# Patient Record
Sex: Female | Born: 1955 | Race: White | Hispanic: No | Smoking: Never smoker
Health system: Southern US, Community
[De-identification: ages and names within clinical notes are randomized; demographics above are authoritative.]

## PROBLEM LIST (undated history)

## (undated) DIAGNOSIS — A6920 Lyme disease, unspecified: Secondary | ICD-10-CM

## (undated) DIAGNOSIS — I493 Ventricular premature depolarization: Secondary | ICD-10-CM

## (undated) HISTORY — PX: HEMORRHOID SURGERY: SHX153

## (undated) HISTORY — PX: TONSILLECTOMY: SUR1361

## (undated) HISTORY — PX: ABDOMINAL HYSTERECTOMY: SHX81

## (undated) HISTORY — DX: Ventricular premature depolarization: I49.3

## (undated) HISTORY — PX: CHOLECYSTECTOMY: SHX55

---

## 2013-11-26 ENCOUNTER — Ambulatory Visit: Payer: Self-pay | Admitting: Unknown Physician Specialty

## 2014-07-08 ENCOUNTER — Other Ambulatory Visit: Payer: Self-pay | Admitting: Internal Medicine

## 2014-07-08 DIAGNOSIS — Z1231 Encounter for screening mammogram for malignant neoplasm of breast: Secondary | ICD-10-CM

## 2014-08-10 ENCOUNTER — Ambulatory Visit
Admission: RE | Admit: 2014-08-10 | Discharge: 2014-08-10 | Disposition: A | Discharge status: Home / Self Care | Payer: BC Managed Care – PPO | Source: Ambulatory Visit | Attending: Internal Medicine | Admitting: Internal Medicine

## 2014-08-10 ENCOUNTER — Other Ambulatory Visit: Payer: Self-pay | Admitting: Internal Medicine

## 2014-08-10 DIAGNOSIS — Z1231 Encounter for screening mammogram for malignant neoplasm of breast: Secondary | ICD-10-CM

## 2020-10-02 ENCOUNTER — Emergency Department (HOSPITAL_COMMUNITY)
Admission: EM | Admit: 2020-10-02 | Discharge: 2020-10-02 | Disposition: A | Discharge status: Home / Self Care | Payer: Medicare Other | Attending: Emergency Medicine | Admitting: Emergency Medicine

## 2020-10-02 ENCOUNTER — Encounter (HOSPITAL_COMMUNITY): Payer: Self-pay | Admitting: Emergency Medicine

## 2020-10-02 ENCOUNTER — Emergency Department (HOSPITAL_COMMUNITY): Payer: Medicare Other

## 2020-10-02 ENCOUNTER — Other Ambulatory Visit: Payer: Self-pay

## 2020-10-02 DIAGNOSIS — R079 Chest pain, unspecified: Secondary | ICD-10-CM

## 2020-10-02 DIAGNOSIS — R42 Dizziness and giddiness: Secondary | ICD-10-CM | POA: Diagnosis not present

## 2020-10-02 DIAGNOSIS — R0602 Shortness of breath: Secondary | ICD-10-CM | POA: Insufficient documentation

## 2020-10-02 DIAGNOSIS — R0789 Other chest pain: Secondary | ICD-10-CM | POA: Diagnosis present

## 2020-10-02 LAB — CBC
HCT: 43.6 % (ref 36.0–46.0)
Hemoglobin: 15 g/dL (ref 12.0–15.0)
MCH: 32.3 pg (ref 26.0–34.0)
MCHC: 34.4 g/dL (ref 30.0–36.0)
MCV: 94 fL (ref 80.0–100.0)
Platelets: 291 10*3/uL (ref 150–400)
RBC: 4.64 MIL/uL (ref 3.87–5.11)
RDW: 13 % (ref 11.5–15.5)
WBC: 16.2 10*3/uL — ABNORMAL HIGH (ref 4.0–10.5)
nRBC: 0 % (ref 0.0–0.2)

## 2020-10-02 LAB — BASIC METABOLIC PANEL
Anion gap: 13 (ref 5–15)
BUN: 12 mg/dL (ref 8–23)
CO2: 22 mmol/L (ref 22–32)
Calcium: 9.4 mg/dL (ref 8.9–10.3)
Chloride: 105 mmol/L (ref 98–111)
Creatinine, Ser: 0.8 mg/dL (ref 0.44–1.00)
GFR, Estimated: >60 mL/min (ref >60)
Glucose, Bld: 104 mg/dL — ABNORMAL HIGH (ref 70–99)
Potassium: 3.5 mmol/L (ref 3.5–5.1)
Sodium: 140 mmol/L (ref 135–145)

## 2020-10-02 LAB — TROPONIN I (HIGH SENSITIVITY)
Troponin I (High Sensitivity): <2 ng/L (ref <18)
Troponin I (High Sensitivity): 3 ng/L (ref <18)

## 2020-10-02 LAB — PROTIME-INR
INR: 1.1 (ref 0.8–1.2)
Prothrombin Time: 13.3 seconds (ref 11.4–15.2)

## 2020-10-02 NOTE — ED Triage Notes (Signed)
Patient reports central chest pain " pressure" radiating to upper back with mild SOB , no emesis or diaphoresis , no cough or fever .

## 2020-10-02 NOTE — ED Provider Notes (Incomplete)
[facility] DEPART[facility] Provider Note   CSN: [id number] Arrival date & time: 10/02/20  1949     History Chief Complaint  Patient presents with  . Chest Pain    Natalie Wyatt is a 65 y.o. female.  65 year old female presents to the emergency department for evaluation of chest pain.  She reports a central chest pain that she describes as a pressure sensation.  It began as she was sitting down to eat dinner around 1800.  It has remained constant, but has been spontaneously improving.  Reports radiation of the pain to her upper back.  Has had mild shortness of breath as well as lightheadedness.  Had moments of feeling hot and cold, but was not diaphoretic and has not experienced any nausea, vomiting, cough, fever, hemoptysis, leg swelling, syncope, or recent illness.  Denies any exertional component to her chest pain.  Took a Flexeril and an expired Xanax for her pain PTA without any notable change.  Did have a similar episode of pain in October, but never sought evaluation.  No PMH of HTN, HLD, CAD, tobacco use. Reports FHx of ACS in father who passed in his 64's from an MI. Had a normal TTE in 2020.  The history is provided by the patient. No language interpreter was used.       History reviewed. No pertinent past medical history.  There are no problems to display for this patient.   History reviewed. No pertinent surgical history.   OB History   No obstetric history on file.     No family history on file.  Social History   Tobacco Use  . Smoking status: Never Smoker  . Smokeless tobacco: Never Used  Substance Use Topics  . Alcohol use: Never  . Drug use: Never    Home Medications Prior to Admission medications   Not on File    Allergies    Patient has no known allergies.  Review of Systems   Review of Systems  Ten systems reviewed and are negative for acute change, except as noted in the HPI.    Physical Exam Updated Vital Signs BP  137/79 (BP Location: Right Arm)   Pulse 84   Temp 98.6 F (37 C) (Oral)   Resp 15   Ht 5\' 2"  (1.575 m)   Wt 72 kg   SpO2 100%   BMI 29.03 kg/m   Physical Exam Vitals and nursing note reviewed.  Constitutional:      General: She is not in acute distress.    Appearance: She is well-developed and well-nourished. She is not diaphoretic.     Comments: Nontoxic appearing and in NAD  HENT:     Head: Normocephalic and atraumatic.  Eyes:     General: No scleral icterus.    Extraocular Movements: EOM normal.     Conjunctiva/sclera: Conjunctivae normal.  Cardiovascular:     Rate and Rhythm: Normal rate and regular rhythm.     Pulses: Normal pulses.  Pulmonary:     Effort: Pulmonary effort is normal. No respiratory distress.     Breath sounds: No stridor. No wheezing, rhonchi or rales.     Comments: Lungs CTAB. Respirations even and unlabored. Musculoskeletal:        General: Normal range of motion.     Cervical back: Normal range of motion.  Skin:    General: Skin is warm and dry.     Coloration: Skin is not pale.     Findings: No erythema  or rash.  Neurological:     Mental Status: She is alert and oriented to person, place, and time.     Coordination: Coordination normal.  Psychiatric:        Mood and Affect: Mood and affect normal.        Behavior: Behavior normal.     ED Results / Procedures / Treatments   Labs (all labs ordered are listed, but only abnormal results are displayed) Labs Reviewed  BASIC METABOLIC PANEL - Abnormal; Notable for the following components:      Result Value   Glucose, Bld 104 (*)    All other components within normal limits  CBC - Abnormal; Notable for the following components:   WBC 16.2 (*)    All other components within normal limits  PROTIME-INR  TROPONIN I (HIGH SENSITIVITY)  TROPONIN I (HIGH SENSITIVITY)    EKG EKG Interpretation  Date/Time:  Sunday October 02 2020 19:55:17 EST Ventricular Rate:  79 PR Interval:  178 QRS  Duration: 80 QT Interval:  380 QTC Calculation: 435 R Axis:   57 Text Interpretation: Sinus rhythm with frequent Premature ventricular complexes Nonspecific T wave abnormality Abnormal ECG Baseline wander No old tracing to compare Confirmed by Deno Etienne [phone]) on 10/02/2020 11:01:46 PM   Radiology DG Chest 2 View  Result Date: 10/02/2020 CLINICAL DATA:  Chest pressure with shortness of breath. EXAM: CHEST - 2 VIEW COMPARISON:  None. FINDINGS: There is no evidence of acute infiltrate, pleural effusion or pneumothorax. The heart size and mediastinal contours are within normal limits. Mild calcification of the aortic arch is seen. The visualized skeletal structures are unremarkable. IMPRESSION: No active cardiopulmonary disease. Electronically Signed   By: Virgina Norfolk M.D.   On: 10/02/2020 20:33    Procedures Procedures {Remember to document critical care time when appropriate:1}  Medications Ordered in ED Medications - No data to display  ED Course  I have reviewed the triage vital signs and the nursing notes.  Pertinent labs & imaging results that were available during my care of the patient were reviewed by me and considered in my medical decision making (see chart for details).    MDM Rules/Calculators/A&P                          ***  Vitals:   10/02/20 1957 10/02/20 2001 10/02/20 2117 10/02/20 2321  BP: (!) 162/85  (!) 167/89 137/79  Pulse: 76  89 84  Resp: 17  17 15   Temp: 98.2 F (36.8 C)   98.6 F (37 C)  TempSrc:    Oral  SpO2: 98%  98% 100%  Weight:  72 kg    Height:  5\' 2"  (1.575 m)      Final Clinical Impression(s) / ED Diagnoses Final diagnoses:  Nonspecific chest pain    Rx / DC Orders ED Discharge Orders    None

## 2020-10-02 NOTE — ED Provider Notes (Signed)
[name]   CSN: [id number] Arrival date & time: 10/02/20  1949     History Chief Complaint  Natalie Wyatt presents with  . Chest Pain    Natalie Wyatt is a 65 y.o. female.  65 year old female presents to the emergency department for evaluation of chest pain.  She reports a central chest pain that she describes as a pressure sensation.  It began as she was sitting down to eat dinner around 1800.  It has remained constant, but has been spontaneously improving.  Reports radiation of the pain to her upper back.  Has had mild shortness of breath as well as lightheadedness.  Had moments of feeling hot and cold, but was not diaphoretic and has not experienced any nausea, vomiting, cough, fever, hemoptysis, leg swelling, syncope, or recent illness.  Denies any exertional component to her chest pain.  Took a Flexeril and an expired Xanax for her pain PTA without any notable change.  Did have a similar episode of pain in October, but never sought evaluation.  No PMH of HTN, HLD, CAD, tobacco use. Reports FHx of ACS in father who passed in his 64's from an MI. Had a normal TTE in 2020.  The history is provided by the Natalie Wyatt. No language interpreter was used.       History reviewed. No pertinent past medical history.  There are no problems to display for this Natalie Wyatt.   History reviewed. No pertinent surgical history.   OB History   No obstetric history on file.     No family history on file.  Social History   Tobacco Use  . Smoking status: Never Smoker  . Smokeless tobacco: Never Used  Substance Use Topics  . Alcohol use: Never  . Drug use: Never    Home Medications Prior to Admission medications   Not on File    Allergies    Natalie Wyatt has no known allergies.  Review of Systems   Review of Systems  Ten systems reviewed and are negative for acute change, except as noted in the HPI.    Physical Exam Updated Vital Signs BP  137/79 (BP Location: Right Arm)   Pulse 84   Temp 98.6 F (37 C) (Oral)   Resp 15   Ht 5\' 2"  (1.575 m)   Wt 72 kg   SpO2 100%   BMI 29.03 kg/m   Physical Exam Vitals and nursing note reviewed.  Constitutional:      General: She is not in acute distress.    Appearance: She is well-developed and well-nourished. She is not diaphoretic.     Comments: Nontoxic appearing and in NAD  HENT:     Head: Normocephalic and atraumatic.  Eyes:     General: No scleral icterus.    Extraocular Movements: EOM normal.     Conjunctiva/sclera: Conjunctivae normal.  Cardiovascular:     Rate and Rhythm: Normal rate and regular rhythm.     Pulses: Normal pulses.  Pulmonary:     Effort: Pulmonary effort is normal. No respiratory distress.     Breath sounds: No stridor. No wheezing, rhonchi or rales.     Comments: Lungs CTAB. Respirations even and unlabored. Musculoskeletal:        General: Normal range of motion.     Cervical back: Normal range of motion.  Skin:    General: Skin is warm and dry.     Coloration: Skin is not pale.     Findings: No erythema  or rash.  Neurological:     Mental Status: She is alert and oriented to person, place, and time.     Coordination: Coordination normal.  Psychiatric:        Mood and Affect: Mood and affect normal.        Behavior: Behavior normal.     ED Results / Procedures / Treatments   Labs (all labs ordered are listed, but only abnormal results are displayed) Labs Reviewed  BASIC METABOLIC PANEL - Abnormal; Notable for the following components:      Result Value   Glucose, Bld 104 (*)    All other components within normal limits  CBC - Abnormal; Notable for the following components:   WBC 16.2 (*)    All other components within normal limits  PROTIME-INR  TROPONIN I (HIGH SENSITIVITY)  TROPONIN I (HIGH SENSITIVITY)    EKG EKG Interpretation  Date/Time:  Sunday October 02 2020 19:55:17 EST Ventricular Rate:  79 PR Interval:  178 QRS  Duration: 80 QT Interval:  380 QTC Calculation: 435 R Axis:   57 Text Interpretation: Sinus rhythm with frequent Premature ventricular complexes Nonspecific T wave abnormality Abnormal ECG Baseline wander No old tracing to compare Confirmed by Deno Etienne [phone]) on 10/02/2020 11:01:46 PM   Radiology DG Chest 2 View  Result Date: 10/02/2020 CLINICAL DATA:  Chest pressure with shortness of breath. EXAM: CHEST - 2 VIEW COMPARISON:  None. FINDINGS: There is no evidence of acute infiltrate, pleural effusion or pneumothorax. The heart size and mediastinal contours are within normal limits. Mild calcification of the aortic arch is seen. The visualized skeletal structures are unremarkable. IMPRESSION: No active cardiopulmonary disease. Electronically Signed   By: Virgina Norfolk M.D.   On: 10/02/2020 20:33    Procedures Procedures   Medications Ordered in ED Medications - No data to display  ED Course  I have reviewed the triage vital signs and the nursing notes.  Pertinent labs & imaging results that were available during my care of the Natalie Wyatt were reviewed by me and considered in my medical decision making (see chart for details).    MDM Rules/Calculators/A&P                          Natalie Wyatt presents to the emergency department for evaluation of chest pain.  Symptom onset around 1800, now spontaneously improving.  Symptoms a bit atypical for ACS.  No associated nausea, diaphoresis; symptoms not aggravated by exertion.  Work up today is reassuring.  EKG is nonischemic and troponin negative.  Chest x-ray without evidence of mediastinal widening to suggest dissection.  No pneumothorax, pneumonia, pleural effusion.  Pulmonary embolus further considered; however, Natalie Wyatt without tachycardia, tachypnea, dyspnea, hypoxia.  Well's negative.  Have advised close follow-up with the Natalie Wyatt's primary care doctor.  We will also refer to cardiology.  She has been advised to have an outpatient stress test  completed.  She is reliable for follow-up and verbalizes understanding of need to return for worsening symptoms.  Discharged in stable condition with no unaddressed concerns.  Vitals:   10/02/20 1957 10/02/20 2001 10/02/20 2117 10/02/20 2321  BP: (!) 162/85  (!) 167/89 137/79  Pulse: 76  89 84  Resp: 17  17 15   Temp: 98.2 F (36.8 C)   98.6 F (37 C)  TempSrc:    Oral  SpO2: 98%  98% 100%  Weight:  72 kg    Height:  5\' 2"  (1.575 m)  Final Clinical Impression(s) / ED Diagnoses Final diagnoses:  Nonspecific chest pain    Rx / DC Orders ED Discharge Orders    None       Antonietta Breach, PA-C 10/03/20 [place], [place], DO 10/04/20 1449

## 2020-10-02 NOTE — Discharge Instructions (Signed)
Your work-up in the emergency department today was reassuring, though we recommend follow-up with your primary care doctor as well as cardiologist.  You would benefit from having a stress test completed within the next 30 days.  Return to the emergency department for any new or concerning symptoms.

## 2020-10-02 NOTE — ED Provider Notes (Signed)
[facility] EMERGENCY DEPARTMENT Provider Note   CSN: [id number] Arrival date & time: 10/02/20  1949     History Chief Complaint  Patient presents with  . Chest Pain    Natalie Wyatt is a 65 y.o. female.  65 year old female presents to the emergency department for evaluation of chest pain.  She describes her chest pain as a pressure.  It is located in her central chest.  Has had some radiation of the pain to her back with mild shortness of breath.  Denies any worsening pain with exertion or change in position.  It has been spontaneously improving.  No associated fevers, nausea, vomiting, diaphoresis, cough, leg swelling, extremity numbness or paresthesias.  She tried taking a Flexeril for symptoms without relief.  Denies personal history of ACS, CAD.  Patient compliant with her daily medications.    She has a history of similar pain a few years ago.  Was evaluated by cardiology and had reassuring TTE in 2020.  EF 55 to 60%.   The history is provided by the patient. No language interpreter was used.       History reviewed. No pertinent past medical history.  There are no problems to display for this patient.   Past Surgical History:  Procedure Laterality Date  . ABDOMINAL HYSTERECTOMY    . CESAREAN SECTION    . HEMORRHOID SURGERY    . TONSILLECTOMY       OB History   No obstetric history on file.     Family History  Problem Relation Age of Onset  . Heart attack Father     Social History   Tobacco Use  . Smoking status: Never Smoker  . Smokeless tobacco: Never Used  Substance Use Topics  . Alcohol use: Never  . Drug use: Never    Home Medications Prior to Admission medications   Medication Sig Start Date End Date Taking? Authorizing Provider  albuterol (VENTOLIN HFA) 108 (90 Base) MCG/ACT inhaler Inhale into the lungs every 6 (six) hours as needed for wheezing or shortness of breath.    [provider]  buPROPion (WELLBUTRIN XL) 300  MG 24 hr tablet Take 300 mg by mouth daily.    [provider]  butalbital-aspirin-caffeine Acquanetta Chain) 50-325-40 MG capsule Take 1 capsule by mouth 2 (two) times daily as needed for headache.    [provider]  cyclobenzaprine (FLEXERIL) 5 MG tablet Take 5 mg by mouth 3 (three) times daily as needed for muscle spasms.    [provider]  EPINEPHrine 0.3 mg/0.3 mL IJ SOAJ injection Inject 0.3 mg into the muscle as needed for anaphylaxis.    [provider]  estradiol (ESTRACE) 2 MG tablet Take 2 mg by mouth daily.    [provider]  hydrochlorothiazide (HYDRODIURIL) 25 MG tablet Take 12.5 mg by mouth daily.    [provider]  metoprolol tartrate (LOPRESSOR) 100 MG tablet Take 1 tablet by mouth once for procedure. 10/17/20   O'NealCassie Freer, MD  naproxen (NAPROSYN) 500 MG tablet Take 1,000 mg by mouth 2 (two) times daily with a meal.    [provider]  pantoprazole (PROTONIX) 40 MG tablet Take 40 mg by mouth daily.    [provider]  zolpidem (AMBIEN) 5 MG tablet Take 5 mg by mouth once.    [provider]    Allergies    Patient has no known allergies.  Review of Systems   Review of Systems  Ten systems  reviewed and are negative for acute change, except as noted in the HPI.    Physical Exam Updated Vital Signs BP 137/79 (BP Location: Right Arm)   Pulse 84   Temp 98.6 F (37 C) (Oral)   Resp 15   Ht 5\' 2"  (1.575 m)   Wt 72 kg   SpO2 100%   BMI 29.03 kg/m   Physical Exam Vitals and nursing note reviewed.  Constitutional:      General: She is not in acute distress.    Appearance: She is well-developed. She is not diaphoretic.     Comments: Nontoxic appearing and in NAD  HENT:     Head: Normocephalic and atraumatic.  Eyes:     General: No scleral icterus.    Conjunctiva/sclera: Conjunctivae normal.  Cardiovascular:     Rate and Rhythm: Normal rate and regular rhythm.     Pulses: Normal  pulses.  Pulmonary:     Effort: Pulmonary effort is normal. No respiratory distress.     Breath sounds: No stridor. No wheezing or rales.     Comments: Lungs CTAB. Respirations even and unlabored. Musculoskeletal:        General: Normal range of motion.     Cervical back: Normal range of motion.     Comments: No BLE edema.  Skin:    General: Skin is warm and dry.     Coloration: Skin is not pale.     Findings: No erythema or rash.  Neurological:     Mental Status: She is alert and oriented to person, place, and time.     Coordination: Coordination normal.  Psychiatric:        Behavior: Behavior normal.     ED Results / Procedures / Treatments   Labs (all labs ordered are listed, but only abnormal results are displayed) Labs Reviewed  BASIC METABOLIC PANEL - Abnormal; Notable for the following components:      Result Value   Glucose, Bld 104 (*)    All other components within normal limits  CBC - Abnormal; Notable for the following components:   WBC 16.2 (*)    All other components within normal limits  PROTIME-INR  TROPONIN I (HIGH SENSITIVITY)  TROPONIN I (HIGH SENSITIVITY)    EKG EKG Interpretation  Date/Time:  Sunday October 02 2020 19:55:17 EST Ventricular Rate:  79 PR Interval:  178 QRS Duration: 80 QT Interval:  380 QTC Calculation: 435 R Axis:   57 Text Interpretation: Sinus rhythm with frequent Premature ventricular complexes Nonspecific T wave abnormality Abnormal ECG Baseline wander No old tracing to compare Confirmed by Deno Etienne [phone]) on 10/02/2020 11:01:46 PM   Radiology DG Chest 2 View  Result Date: 10/02/2020 CLINICAL DATA:  Chest pressure with shortness of breath. EXAM: CHEST - 2 VIEW COMPARISON:  None. FINDINGS: There is no evidence of acute infiltrate, pleural effusion or pneumothorax. The heart size and mediastinal contours are within normal limits. Mild calcification of the aortic arch is seen. The visualized skeletal structures are unremarkable.  IMPRESSION: No active cardiopulmonary disease. Electronically Signed   By: Virgina Norfolk M.D.   On: 10/02/2020 20:33    Procedures Procedures   Medications Ordered in ED Medications - No data to display  ED Course  I have reviewed the triage vital signs and the nursing notes.  Pertinent labs & imaging results that were available during my care of the patient were reviewed by me and considered in my medical decision making (see chart for details).  MDM Rules/Calculators/A&P                          Patient presents to the emergency department for evaluation of chest pain, now spontaneously improved.  Low suspicion for emergent cardiac etiology given reassuring workup today.  EKG not concerning for acute ischemia and troponin negative x 2.  Chest x-ray without evidence of mediastinal widening to suggest dissection.  No pneumothorax, pneumonia, pleural effusion.  Pulmonary embolus further considered; however, patient without tachycardia, tachypnea, hypoxia.  Symptoms atypical for PE.  Has a history of similar chest pain in 2020.  Underwent echocardiogram at this time which was reassuring.  Have recommended close outpatient follow-up with cardiology as patient would benefit from repeat echo, stress test.  Referral provided to [facility] cardiology.  She has been advised to return to the ED if symptoms persist or worsen.  Discharged in stable condition with no unaddressed concerns.   Final Clinical Impression(s) / ED Diagnoses Final diagnoses:  Nonspecific chest pain    Rx / DC Orders ED Discharge Orders    None       Antonietta Breach, PA-C 10/19/20 [place], [place], DO 10/19/20 [phone]

## 2020-10-10 ENCOUNTER — Telehealth: Payer: Self-pay

## 2020-10-10 NOTE — Telephone Encounter (Signed)
[facility] [phone], SENT REFERRAL TO [facility]

## 2020-10-14 ENCOUNTER — Telehealth: Payer: Self-pay

## 2020-10-14 NOTE — Telephone Encounter (Signed)
Faxed notes to [facility] for appointment with Dr. Audie Box on 10-17-20 at 11:20 a.

## 2020-10-16 NOTE — Progress Notes (Signed)
Cardiology Office Note:   Date:  10/17/2020  NAME:  Natalie Wyatt    MRN: [id number] DOB:  12-03-1955   PCP:  Delano Metz, MD  Cardiologist:  No primary care provider on file.  Electrophysiologist:  None   Referring MD: Gae Gallop, PA-C   Chief Complaint  Patient presents with  . Chest Pain    History of Present Illness:   Natalie Wyatt is a 65 y.o. female without medical history who is being seen today for the evaluation of chest pain at the request of Delano Metz, MD. Seen in ER 10/02/2020 for CP. Negative ACS work up. Follow-up today.  She reports that she went to the emergency room with intense chest pressure that went into her upper back.  She reports the pain started as cramping in her abdomen.  She reports symptoms started randomly.  She reports no identifiable triggers or alleviating factors.  Her symptoms resolved while waiting to be seen in the emergency room.  She was seen in the emergency room and work-up was negative for an acute coronary syndrome.  She does have PVCs on her EKG today.  Apparently she has had PVCs for quite some time.  She had an echocardiogram at [facility] on 04/22/2019 that showed normal LV function.  She had mild mitral regurgitation.  She is never had a stress test that I can tell.  She reports her symptoms were quite bothersome.  Her father had a history of heart disease.  I do not have her lipid profile but she reports no medical history.  She takes HCTZ for lower extremity edema.  She has never had a heart attack or a stroke.  She does take Wellbutrin.  She has had several surgeries but no heart procedures.  She is a never smoker.  She does not drink alcohol or use drugs.  She is a retired Radio producer.  She now works part-time with her husband who is a Engineer, maintenance (IT).  They currently live close to [place].  She reports is not stressed.  Her symptoms have resolved.  She was noted to have an elevated white blood cell count in the emergency room.  She has no infectious  symptoms.  She denies any cough, fevers, chills, shortness of breath today in office.  She reports her chest pain symptoms have not recurred.  Past Medical History: History reviewed. No pertinent past medical history.  Past Surgical History: Past Surgical History:  Procedure Laterality Date  . ABDOMINAL HYSTERECTOMY    . CESAREAN SECTION    . HEMORRHOID SURGERY    . TONSILLECTOMY      Current Medications: Current Meds  Medication Sig  . albuterol (VENTOLIN HFA) 108 (90 Base) MCG/ACT inhaler Inhale into the lungs every 6 (six) hours as needed for wheezing or shortness of breath.  Marland Kitchen buPROPion (WELLBUTRIN XL) 300 MG 24 hr tablet Take 300 mg by mouth daily.  . butalbital-aspirin-caffeine (FIORINAL) 50-325-40 MG capsule Take 1 capsule by mouth 2 (two) times daily as needed for headache.  . cyclobenzaprine (FLEXERIL) 5 MG tablet Take 5 mg by mouth 3 (three) times daily as needed for muscle spasms.  Marland Kitchen EPINEPHrine 0.3 mg/0.3 mL IJ SOAJ injection Inject 0.3 mg into the muscle as needed for anaphylaxis.  Marland Kitchen estradiol (ESTRACE) 2 MG tablet Take 2 mg by mouth daily.  . hydrochlorothiazide (HYDRODIURIL) 25 MG tablet Take 12.5 mg by mouth daily.  . metoprolol tartrate (LOPRESSOR) 100 MG tablet Take 1 tablet by mouth once for procedure.  Marland Kitchen  naproxen (NAPROSYN) 500 MG tablet Take 1,000 mg by mouth 2 (two) times daily with a meal.  . pantoprazole (PROTONIX) 40 MG tablet Take 40 mg by mouth daily.  Marland Kitchen zolpidem (AMBIEN) 5 MG tablet Take 5 mg by mouth once.     Allergies:    Patient has no known allergies.   Social History: Social History   Socioeconomic History  . Marital status: Married    Spouse name: Not on file  . Number of children: 2  . Years of education: Not on file  . Highest education level: Not on file  Occupational History  . Occupation: retired Education officer, museum  Tobacco Use  . Smoking status: Never Smoker  . Smokeless tobacco: Never Used  Substance and Sexual Activity  . Alcohol  use: Never  . Drug use: Never  . Sexual activity: Not on file  Other Topics Concern  . Not on file  Social History Narrative  . Not on file   Social Determinants of Health   Financial Resource Strain: Not on file  Food Insecurity: Not on file  Transportation Needs: Not on file  Physical Activity: Not on file  Stress: Not on file  Social Connections: Not on file     Family History: The patient's family history includes Heart attack in her father.  ROS:   All other ROS reviewed and negative. Pertinent positives noted in the HPI.     EKGs/Labs/Other Studies Reviewed:   The following studies were personally reviewed by me today:  EKG:  EKG is ordered today.  The ekg ordered today demonstrates normal sinus rhythm heart rate 67, frequent PVCs noted, and was personally reviewed by me.   Recent Labs: 10/02/2020: BUN 12; Creatinine, Ser 0.80; Hemoglobin 15.0; Platelets 291; Potassium 3.5; Sodium 140   Recent Lipid Panel No results found for: CHOL, TRIG, HDL, CHOLHDL, VLDL, LDLCALC, LDLDIRECT  Physical Exam:   VS:  BP (!) 142/76 (BP Location: Left Arm, Patient Position: Sitting, Cuff Size: Normal)   Pulse 67   Ht 5\' 2"  (1.575 m)   Wt 158 lb (71.7 kg)   BMI 28.90 kg/m    Wt Readings from Last 3 Encounters:  10/17/20 158 lb (71.7 kg)  10/02/20 158 lb 11.7 oz (72 kg)    General: Well nourished, well developed, in no acute distress Head: Atraumatic, normal size  Eyes: PEERLA, EOMI  Neck: Supple, no JVD Endocrine: No thryomegaly Cardiac: Normal S1, S2; irregular rhythm, no murmurs rubs or gallops Lungs: Clear to auscultation bilaterally, no wheezing, rhonchi or rales  Abd: Soft, nontender, no hepatomegaly  Ext: No edema, pulses 2+ Musculoskeletal: No deformities, BUE and BLE strength normal and equal Skin: Warm and dry, no rashes   Neuro: Alert and oriented to person, place, time, and situation, CNII-XII grossly intact, no focal deficits  Psych: Normal mood and affect    ASSESSMENT:   Natalie Wyatt is a 65 y.o. female who presents for the following: 1. Precordial pain   2. PVC (premature ventricular contraction)   3. Irregular heart rate   4. Abnormal findings on diagnostic imaging of heart and coronary circulation      PLAN:   1. Chest pain, unspecified type -Atypical chest pain.  Described as pressure at rest.  Symptoms with away without intervention.  Seen in the emergency room and work-up was negative for an acute coronary syndrome.  She was then sent to follow-up with [place].  Her white blood cell count was elevated but she had no infectious  symptoms.  I would like to repeat her CBC.  Her EKG demonstrates sinus rhythm with PVCs.  She needs the PVCs evaluated see below.  Given her chest pain episodes of PVCs I think this is a good time to evaluate her coronaries with a coronary CTA.  We will recheck a BMP.  She will take 100 mg of metoprolol tartrate 2 hours before the scan.  She needs a repeat echo as well as Zio patch discussed below.  Overall I suspect this was gas or acid reflux related.  I do not think this is going to be related to obstructive CAD but will make sure.  2. PVC (premature ventricular contraction) -PVCs on her EKG today.  She has had these for years.  Echo in 2020 was normal.  I would like to repeat an echocardiogram here.  I would also like to quantify her PVC burden with a 3-day Zio patch.  We also will check a CBC, BMP and TSH.  We will make sure electrolytes are stable and her thyroid is normal.  She has no evidence of heart failure.  She has no symptoms from this.  This would only need to be treated if she has very burdensome level of PVCs or if she develops symptoms or has congestive heart failure which she does not currently on examination today.  Disposition: Return in about 3 months (around 01/17/2021).  Medication Adjustments/Labs and Tests Ordered: Current medicines are reviewed at length with the patient today.  Concerns regarding  medicines are outlined above.  Orders Placed This Encounter  Procedures  . CT CORONARY MORPH W/CTA COR W/SCORE W/CA W/CM &/OR WO/CM  . CT CORONARY FRACTIONAL FLOW RESERVE DATA PREP  . CT CORONARY FRACTIONAL FLOW RESERVE FLUID ANALYSIS  . CBC  . TSH  . Basic metabolic panel  . LONG TERM MONITOR (3-14 DAYS)  . EKG 12-Lead  . ECHOCARDIOGRAM COMPLETE   Meds ordered this encounter  Medications  . metoprolol tartrate (LOPRESSOR) 100 MG tablet    Sig: Take 1 tablet by mouth once for procedure.    Dispense:  1 tablet    Refill:  0    Patient Instructions  Medication Instructions:  Take Metoprolol 100 mg two hours before CT when scheduled.   *If you need a refill on your cardiac medications before your next appointment, please call your pharmacy*   Lab Work: CBC, BMET, TSH today  If you have labs (blood work) drawn today and your tests are completely normal, you will receive your results only by: Marland Kitchen MyChart Message (if you have MyChart) OR . A paper copy in the mail If you have any lab test that is abnormal or we need to change your treatment, we will call you to review the results.   Testing/Procedures:  Your physician has requested that you have cardiac CT. Cardiac computed tomography (CT) is a painless test that uses an x-ray machine to take clear, detailed pictures of your heart. For further information please visit [email or web address].tn. Please follow instruction sheet as given.  Echocardiogram - Your physician has requested that you have an echocardiogram. Echocardiography is a painless test that uses sound waves to create images of your heart. It provides your doctor with information about the size and shape of your heart and how well your heart's chambers and valves are working. This procedure takes approximately one hour. There are no restrictions for this procedure. This will be performed at our [facility] location - [address].  ZIO XT- Long Term Monitor  Instructions   Your physician has requested you wear your ZIO patch monitor____3___days.   This is a single patch monitor.  [employer] supplies one patch monitor per enrollment.  Additional stickers are not available.   Please do not apply patch if you will be having a Nuclear Stress Test, Echocardiogram, Cardiac CT, MRI, or Chest Xray during the time frame you would be wearing the monitor. The patch cannot be worn during these tests.  You cannot remove and re-apply the ZIO XT patch monitor.   Your ZIO patch monitor will be sent USPS Priority mail from [facility] directly to your home address. The monitor may also be mailed to a PO BOX if home delivery is not available.   It may take 3-5 days to receive your monitor after you have been enrolled.   Once you have received you monitor, please review enclosed instructions.  Your monitor has already been registered assigning a specific monitor serial # to you.   Applying the monitor   Shave hair from upper left chest.   Hold abrader disc by orange tab.  Rub abrader in 40 strokes over left upper chest as indicated in your monitor instructions.   Clean area with 4 enclosed alcohol pads .  Use all pads to assure are is cleaned thoroughly.  Let dry.   Apply patch as indicated in monitor instructions.  Patch will be place under collarbone on left side of chest with arrow pointing upward.   Rub patch adhesive wings for 2 minutes.Remove white label marked "1".  Remove white label marked "2".  Rub patch adhesive wings for 2 additional minutes.   While looking in a mirror, press and release button in center of patch.  A small green light will flash 3-4 times .  This will be your only indicator the monitor has been turned on.     Do not shower for the first 24 hours.  You may shower after the first 24 hours.   Press button if you feel a symptom. You will hear a small click.  Record Date, Time and Symptom in the Patient Log Book.   When you are  ready to remove patch, follow instructions on last 2 pages of Patient Log Book.  Stick patch monitor onto last page of Patient Log Book.   Place Patient Log Book in [place] box.  Use locking tab on box and tape box closed securely.  The [employer] has [employer] on it.  Please place in mailbox as soon as possible.  Your physician should have your test results approximately 7 days after the monitor has been mailed back to [facility].   Call [place] at [phone] if you have questions regarding your ZIO XT patch monitor.  Call them immediately if you see an orange light blinking on your monitor.   If your monitor falls off in less than 4 days contact our Monitor department at [phone].  If your monitor becomes loose or falls off after 4 days call [employer] at [phone] for suggestions on securing your monitor.     Follow-Up: At [facility], you and your health needs are our priority.  As part of our continuing mission to provide you with exceptional heart care, we have created designated Provider Care Teams.  These Care Teams include your primary Cardiologist (physician) and Advanced Practice Providers (APPs -  Physician Assistants and Nurse Practitioners) who all work together to provide you with  the care you need, when you need it.  We recommend signing up for the patient portal called "MyChart".  Sign up information is provided on this After Visit Summary.  MyChart is used to connect with patients for Virtual Visits (Telemedicine).  Patients are able to view lab/test results, encounter notes, upcoming appointments, etc.  Non-urgent messages can be sent to your provider as well.   To learn more about what you can do with MyChart, go to [email or web address].    Your next appointment:   3 month(s)  The format for your next appointment:   In Person  Provider:   Eleonore Chiquito, MD   Other Instructions Your cardiac CT will be scheduled at one  of the below locations:   [facility] [address] [place], [place] [address] [phone]  If scheduled at [facility], please arrive at the [facility] main entrance (entrance A) of [facility] 30 minutes prior to test start time. Proceed to the [facility] Radiology Department (first floor) to check-in and test prep.  Please follow these instructions carefully (unless otherwise directed):  Hold all erectile dysfunction medications at least 3 days (72 hrs) prior to test.  On the Night Before the Test: . Be sure to Drink plenty of water. . Do not consume any caffeinated/decaffeinated beverages or chocolate 12 hours prior to your test. . Do not take any antihistamines 12 hours prior to your test.  On the Day of the Test: . Drink plenty of water until 1 hour prior to the test. . Do not eat any food 4 hours prior to the test. . You may take your regular medications prior to the test.  . Take metoprolol (Lopressor) two hours prior to test. . HOLD Furosemide/Hydrochlorothiazide morning of the test. . FEMALES- please wear underwire-free bra if available    After the Test: . Drink plenty of water. . After receiving IV contrast, you may experience a mild flushed feeling. This is normal. . On occasion, you may experience a mild rash up to 24 hours after the test. This is not dangerous. If this occurs, you can take Benadryl 25 mg and increase your fluid intake. . If you experience trouble breathing, this can be serious. If it is severe call 911 IMMEDIATELY. If it is mild, please call our office. . If you take any of these medications: Glipizide/Metformin, Avandament, Glucavance, please do not take 48 hours after completing test unless otherwise instructed.   Once we have confirmed authorization from your insurance company, we will call you to set up a date and time for your test. Based on how quickly your insurance processes prior authorizations requests,  please allow up to 4 weeks to be contacted for scheduling your Cardiac CT appointment. Be advised that routine Cardiac CT appointments could be scheduled as many as 8 weeks after your provider has ordered it.  For non-scheduling related questions, please contact the cardiac imaging nurse navigator should you have any questions/concerns: Marchia Bond, Cardiac Imaging Nurse Navigator Gordy Clement, Cardiac Imaging Nurse Navigator [facility] Direct Office Dial: [phone]   For scheduling needs, including cancellations and rescheduling, please call [place], [phone].        Signed, Addison Naegeli. Audie Box, MD, [place]  [address] [place], [place] [address] [phone]  10/17/2020 12:00 PM

## 2020-10-17 ENCOUNTER — Ambulatory Visit (INDEPENDENT_AMBULATORY_CARE_PROVIDER_SITE_OTHER): Payer: Medicare Other

## 2020-10-17 ENCOUNTER — Ambulatory Visit (INDEPENDENT_AMBULATORY_CARE_PROVIDER_SITE_OTHER): Payer: Medicare Other | Admitting: Cardiovascular Disease

## 2020-10-17 ENCOUNTER — Encounter: Payer: Self-pay | Admitting: *Deleted

## 2020-10-17 ENCOUNTER — Other Ambulatory Visit: Payer: Self-pay

## 2020-10-17 ENCOUNTER — Encounter: Payer: Self-pay | Admitting: Cardiovascular Disease

## 2020-10-17 VITALS — BP 142/76 | HR 67 | Ht 62.0 in | Wt 158.0 lb

## 2020-10-17 DIAGNOSIS — I499 Cardiac arrhythmia, unspecified: Secondary | ICD-10-CM

## 2020-10-17 DIAGNOSIS — R072 Precordial pain: Secondary | ICD-10-CM

## 2020-10-17 DIAGNOSIS — R931 Abnormal findings on diagnostic imaging of heart and coronary circulation: Secondary | ICD-10-CM

## 2020-10-17 DIAGNOSIS — I493 Ventricular premature depolarization: Secondary | ICD-10-CM

## 2020-10-17 DIAGNOSIS — R079 Chest pain, unspecified: Secondary | ICD-10-CM

## 2020-10-17 MED ORDER — METOPROLOL TARTRATE 100 MG PO TABS: ORAL_TABLET | ORAL | 0 refills | Status: DC

## 2020-10-17 NOTE — Patient Instructions (Signed)
Medication Instructions:  Take Metoprolol 100 mg two hours before CT when scheduled.   *If you need a refill on your cardiac medications before your next appointment, please call your pharmacy*   Lab Work: CBC, BMET, TSH today  If you have labs (blood work) drawn today and your tests are completely normal, you will receive your results only by: Marland Kitchen MyChart Message (if you have MyChart) OR . A paper copy in the mail If you have any lab test that is abnormal or we need to change your treatment, we will call you to review the results.   Testing/Procedures:  Your physician has requested that you have cardiac CT. Cardiac computed tomography (CT) is a painless test that uses an x-ray machine to take clear, detailed pictures of your heart. For further information please visit [email or web address].[email or web address]. Please follow instruction sheet as given.  Echocardiogram - Your physician has requested that you have an echocardiogram. Echocardiography is a painless test that uses sound waves to create images of your heart. It provides your doctor with information about the size and shape of your heart and how well your heart's chambers and valves are working. This procedure takes approximately one hour. There are no restrictions for this procedure. This will be performed at our [facility] location - [address].  ZIO XT- Long Term Monitor Instructions   Your physician has requested you wear your ZIO patch monitor____3___days.   This is a single patch monitor.  [employer] supplies one patch monitor per enrollment.  Additional stickers are not available.   Please do not apply patch if you will be having a Nuclear Stress Test, Echocardiogram, Cardiac CT, MRI, or Chest Xray during the time frame you would be wearing the monitor. The patch cannot be worn during these tests.  You cannot remove and re-apply the ZIO XT patch monitor.   Your ZIO patch monitor will be sent USPS Priority mail from [facility] directly to your home address. The monitor may also be mailed to a PO BOX if home delivery is not available.   It may take 3-5 days to receive your monitor after you have been enrolled.   Once you have received you monitor, please review enclosed instructions.  Your monitor has already been registered assigning a specific monitor serial # to you.   Applying the monitor   Shave hair from upper left chest.   Hold abrader disc by orange tab.  Rub abrader in 40 strokes over left upper chest as indicated in your monitor instructions.   Clean area with 4 enclosed alcohol pads .  Use all pads to assure are is cleaned thoroughly.  Let dry.   Apply patch as indicated in monitor instructions.  Patch will be place under collarbone on left side of chest with arrow pointing upward.   Rub patch adhesive wings for 2 minutes.Remove white label marked "1".  Remove white label marked "2".  Rub patch adhesive wings for 2 additional minutes.   While looking in a mirror, press and release button in center of patch.  A small green light will flash 3-4 times .  This will be your only indicator the monitor has been turned on.     Do not shower for the first 24 hours.  You may shower after the first 24 hours.   Press button if you feel a symptom. You will hear a small click.  Record Date, Time and Symptom in the Patient Log Book.  When you are ready to remove patch, follow instructions on last 2 pages of Patient Log Book.  Stick patch monitor onto last page of Patient Log Book.   Place Patient Log Book in [place] box.  Use locking tab on box and tape box closed securely.  The [employer] has [employer] on it.  Please place in mailbox as soon as possible.  Your physician should have your test results approximately 7 days after the monitor has been mailed back to [facility].   Call [place] at [phone] if you have questions regarding your ZIO XT patch monitor.   Call them immediately if you see an orange light blinking on your monitor.   If your monitor falls off in less than 4 days contact our Monitor department at [phone].  If your monitor becomes loose or falls off after 4 days call [employer] at [phone] for suggestions on securing your monitor.     Follow-Up: At [facility], you and your health needs are our priority.  As part of our continuing mission to provide you with exceptional heart care, we have created designated Provider Care Teams.  These Care Teams include your primary Cardiologist (physician) and Advanced Practice Providers (APPs -  Physician Assistants and Nurse Practitioners) who all work together to provide you with the care you need, when you need it.  We recommend signing up for the patient portal called "MyChart".  Sign up information is provided on this After Visit Summary.  MyChart is used to connect with patients for Virtual Visits (Telemedicine).  Patients are able to view lab/test results, encounter notes, upcoming appointments, etc.  Non-urgent messages can be sent to your provider as well.   To learn more about what you can do with MyChart, go to [email or web address].    Your next appointment:   3 month(s)  The format for your next appointment:   In Person  Provider:   Eleonore Chiquito, MD   Other Instructions Your cardiac CT will be scheduled at one of the below locations:   [facility] [address] [place], [place] [address] [phone]  If scheduled at [facility], please arrive at the [facility] main entrance (entrance A) of [facility] 30 minutes prior to test start time. Proceed to the [facility] Radiology Department (first floor) to check-in and test prep.  Please follow these instructions carefully (unless otherwise directed):  Hold all erectile dysfunction medications at least 3 days (72 hrs) prior to test.  On the Night Before the Test: . Be sure to  Drink plenty of water. . Do not consume any caffeinated/decaffeinated beverages or chocolate 12 hours prior to your test. . Do not take any antihistamines 12 hours prior to your test.  On the Day of the Test: . Drink plenty of water until 1 hour prior to the test. . Do not eat any food 4 hours prior to the test. . You may take your regular medications prior to the test.  . Take metoprolol (Lopressor) two hours prior to test. . HOLD Furosemide/Hydrochlorothiazide morning of the test. . FEMALES- please wear underwire-free bra if available    After the Test: . Drink plenty of water. . After receiving IV contrast, you may experience a mild flushed feeling. This is normal. . On occasion, you may experience a mild rash up to 24 hours after the test. This is not dangerous. If this occurs, you can take Benadryl 25 mg and increase  your fluid intake. . If you experience trouble breathing, this can be serious. If it is severe call 911 IMMEDIATELY. If it is mild, please call our office. . If you take any of these medications: Glipizide/Metformin, Avandament, Glucavance, please do not take 48 hours after completing test unless otherwise instructed.   Once we have confirmed authorization from your insurance company, we will call you to set up a date and time for your test. Based on how quickly your insurance processes prior authorizations requests, please allow up to 4 weeks to be contacted for scheduling your Cardiac CT appointment. Be advised that routine Cardiac CT appointments could be scheduled as many as 8 weeks after your provider has ordered it.  For non-scheduling related questions, please contact the cardiac imaging nurse navigator should you have any questions/concerns: Marchia Bond, Cardiac Imaging Nurse Navigator Gordy Clement, Cardiac Imaging Nurse Navigator [facility] Direct Office Dial: [phone]   For scheduling needs, including cancellations and  rescheduling, please call [place], [phone].

## 2020-10-17 NOTE — Progress Notes (Signed)
Patient ID: Natalie Wyatt, female   DOB: 17-Apr-1956, 64 y.o.   MRN: [id number] Patient enrolled for [employer] to ship a 3 day ZIO XT long term holter monitor to her home.

## 2020-10-18 LAB — BASIC METABOLIC PANEL
BUN/Creatinine Ratio: 14 (ref 12–28)
BUN: 11 mg/dL (ref 8–27)
CO2: 23 mmol/L (ref 20–29)
Calcium: 9.2 mg/dL (ref 8.7–10.3)
Chloride: 101 mmol/L (ref 96–106)
Creatinine, Ser: 0.77 mg/dL (ref 0.57–1.00)
Glucose: 84 mg/dL (ref 65–99)
Potassium: 3.5 mmol/L (ref 3.5–5.2)
Sodium: 141 mmol/L (ref 134–144)
eGFR: 85 mL/min/{1.73_m2} (ref >59)

## 2020-10-18 LAB — CBC
Hematocrit: 42.1 % (ref 34.0–46.6)
Hemoglobin: 14.1 g/dL (ref 11.1–15.9)
MCH: 30.7 pg (ref 26.6–33.0)
MCHC: 33.5 g/dL (ref 31.5–35.7)
MCV: 92 fL (ref 79–97)
Platelets: 343 10*3/uL (ref 150–450)
RBC: 4.6 x10E6/uL (ref 3.77–5.28)
RDW: 12.3 % (ref 11.7–15.4)
WBC: 8.5 10*3/uL (ref 3.4–10.8)

## 2020-10-18 LAB — TSH: TSH: 3.11 u[IU]/mL (ref 0.450–4.500)

## 2020-10-21 DIAGNOSIS — I493 Ventricular premature depolarization: Secondary | ICD-10-CM

## 2020-11-02 ENCOUNTER — Telehealth (HOSPITAL_COMMUNITY): Payer: Self-pay | Admitting: *Deleted

## 2020-11-02 NOTE — Telephone Encounter (Signed)
Reaching out to patient to offer assistance regarding upcoming cardiac imaging study; pt verbalizes understanding of appt date/time, parking situation and where to check in, pre-test NPO status and medications ordered, and verified current allergies; name and call back number provided for further questions should they arise  Keary Waterson RN Navigator Cardiac Imaging [facility] [phone] office [phone] cell  Pt to take 100mg metoprolol tartrate 2 hours prior to scan. 

## 2020-11-03 ENCOUNTER — Ambulatory Visit
Admission: RE | Admit: 2020-11-03 | Discharge: 2020-11-03 | Disposition: A | Discharge status: Home / Self Care | Payer: Medicare Other | Source: Ambulatory Visit | Attending: Cardiovascular Disease | Admitting: Cardiovascular Disease

## 2020-11-03 ENCOUNTER — Other Ambulatory Visit: Payer: Self-pay

## 2020-11-03 DIAGNOSIS — R072 Precordial pain: Secondary | ICD-10-CM | POA: Insufficient documentation

## 2020-11-03 HISTORY — DX: Lyme disease, unspecified: A69.20

## 2020-11-03 MED ORDER — NITROGLYCERIN 0.4 MG SL SUBL
0.8000 mg | SUBLINGUAL_TABLET | Freq: Once | SUBLINGUAL | Status: AC
  Administered 2020-11-03: 0.8 mg via SUBLINGUAL

## 2020-11-03 MED ORDER — IOHEXOL 350 MG/ML SOLN
75.0000 mL | Freq: Once | INTRAVENOUS | Status: AC | PRN
  Administered 2020-11-03: 75 mL via INTRAVENOUS

## 2020-11-03 NOTE — Progress Notes (Addendum)
Patient tolerated procedure well. Ambulate w/o difficulty. Sitting in chair drinking water provided. Encouraged to drink extra water today and reasoning explained. Verbalized understanding. All questions answered. ABC intact. No further needs. Discharge from procedure area w/o issues.  

## 2020-11-24 ENCOUNTER — Other Ambulatory Visit: Payer: Self-pay

## 2020-11-24 ENCOUNTER — Ambulatory Visit (HOSPITAL_COMMUNITY): Payer: Medicare Other | Attending: Cardiology

## 2020-11-24 DIAGNOSIS — I499 Cardiac arrhythmia, unspecified: Secondary | ICD-10-CM

## 2020-11-24 DIAGNOSIS — R072 Precordial pain: Secondary | ICD-10-CM | POA: Diagnosis present

## 2020-11-24 DIAGNOSIS — I493 Ventricular premature depolarization: Secondary | ICD-10-CM | POA: Diagnosis present

## 2020-11-24 LAB — ECHOCARDIOGRAM COMPLETE
Area-P 1/2: 3.03 cm2
P 1/2 time: 497 msec
S' Lateral: 3.2 cm

## 2020-12-29 ENCOUNTER — Ambulatory Visit: Payer: Medicare Other | Admitting: Cardiovascular Disease

## 2021-01-01 NOTE — Progress Notes (Signed)
Cardiology Office Note:   Date:  01/03/2021  NAME:  Natalie Wyatt    MRN: [id number] DOB:  [birth year]   PCP:  Delano Metz, MD  Cardiologist:  None  Electrophysiologist:  None   Referring MD: Delano Metz, MD   Chief Complaint  Patient presents with  . Follow-up    3 months.   History of Present Illness:   Natalie Wyatt is a 65 y.o. female with a hx of PVCs who presents for follow-up. Seen for atypical CP. Normal CCTA. 3.7% PVC burden.  She reports she is doing well.  No further episodes of chest pain.  She is not having any palpitations.  She does not notice her PVCs.  Her echocardiogram was normal.  We discussed that this does not need specific treatment.  She reports that she is doing well.  We discussed proper sleep as well as proper diet.  She will work on reducing caffeine consumption.  She overall is without symptoms.  Cardiovascular examination remains normal.  She request a CSA as needed.  Problem List 1. PVC -3.7% burden -0 coronary calcium -normal CCTA 11/03/2020 -EF 55-60%  Past Medical History: Past Medical History:  Diagnosis Date  . Lyme disease, unspecified     Past Surgical History: Past Surgical History:  Procedure Laterality Date  . ABDOMINAL HYSTERECTOMY    . CESAREAN SECTION    . HEMORRHOID SURGERY    . TONSILLECTOMY      Current Medications: Current Meds  Medication Sig  . albuterol (VENTOLIN HFA) 108 (90 Base) MCG/ACT inhaler Inhale into the lungs every 6 (six) hours as needed for wheezing or shortness of breath.  Marland Kitchen buPROPion (WELLBUTRIN XL) 300 MG 24 hr tablet Take 300 mg by mouth daily.  . butalbital-aspirin-caffeine (FIORINAL) 50-325-40 MG capsule Take 1 capsule by mouth 2 (two) times daily as needed for headache.  . cyclobenzaprine (FLEXERIL) 5 MG tablet Take 5 mg by mouth 3 (three) times daily as needed for muscle spasms.  Marland Kitchen EPINEPHrine 0.3 mg/0.3 mL IJ SOAJ injection Inject 0.3 mg into the muscle as needed for anaphylaxis.  Marland Kitchen estradiol  (ESTRACE) 2 MG tablet Take 2 mg by mouth daily.  . hydrochlorothiazide (HYDRODIURIL) 25 MG tablet Take 12.5 mg by mouth daily.  . metoprolol tartrate (LOPRESSOR) 100 MG tablet Take 1 tablet by mouth once for procedure.  . naproxen (NAPROSYN) 500 MG tablet Take 1,000 mg by mouth 2 (two) times daily with a meal.  . pantoprazole (PROTONIX) 40 MG tablet Take 40 mg by mouth daily.  . [DISCONTINUED] zolpidem (AMBIEN) 5 MG tablet Take 5 mg by mouth once.     Allergies:    Peanut-containing drug products, Eggs or egg-derived products, Lactose, Soy allergy, and Wheat bran   Social History: Social History   Socioeconomic History  . Marital status: Married    Spouse name: Not on file  . Number of children: 2  . Years of education: Not on file  . Highest education level: Not on file  Occupational History  . Occupation: retired Education officer, museum  Tobacco Use  . Smoking status: Never Smoker  . Smokeless tobacco: Never Used  Substance and Sexual Activity  . Alcohol use: Never  . Drug use: Never  . Sexual activity: Not on file  Other Topics Concern  . Not on file  Social History Narrative  . Not on file   Social Determinants of Health   Financial Resource Strain: Not on file  Food Insecurity: Not on file  Transportation Needs:  Not on file  Physical Activity: Not on file  Stress: Not on file  Social Connections: Not on file     Family History: The patient's family history includes Heart attack in her father.  ROS:   All other ROS reviewed and negative. Pertinent positives noted in the HPI.     EKGs/Labs/Other Studies Reviewed:   The following studies were personally reviewed by me today:  TTE 11/24/2020 1. Left ventricular ejection fraction, by estimation, is 55 to 60%. The  left ventricle has normal function. The left ventricle has no regional  wall motion abnormalities. Left ventricular diastolic parameters were  normal.  2. Right ventricular systolic function is normal. The  right ventricular  size is normal. There is normal pulmonary artery systolic pressure.  3. The mitral valve is normal in structure. Trivial mitral valve  regurgitation. No evidence of mitral stenosis.  4. The aortic valve is tricuspid. Aortic valve regurgitation is mild. No  aortic stenosis is present.  5. The inferior vena cava is normal in size with greater than 50%  respiratory variability, suggesting right atrial pressure of 3 mmHg.   Zio 10/30/2020 Impression: 1. Occasional PVCs (3.7% burden).   CCTA 11/03/2020  IMPRESSION: 1. Normal coronary calcium score of 0. Patient is low risk for coronary events.  2. Normal coronary origin with right dominance.  3. No evidence of CAD.  4. CAD-RADS 0. Consider non-atherosclerotic causes of chest pain.   Recent Labs: 10/17/2020: BUN 11; Creatinine, Ser 0.77; Hemoglobin 14.1; Platelets 343; Potassium 3.5; Sodium 141; TSH 3.110   Recent Lipid Panel No results found for: CHOL, TRIG, HDL, CHOLHDL, VLDL, LDLCALC, LDLDIRECT  Physical Exam:   VS:  BP 136/84 (BP Location: Left Arm, Patient Position: Sitting, Cuff Size: Normal)   Pulse 68   Ht 5\' 2"  (1.575 m)   Wt 156 lb (70.8 kg)   BMI 28.53 kg/m    Wt Readings from Last 3 Encounters:  01/03/21 156 lb (70.8 kg)  10/17/20 158 lb (71.7 kg)  10/02/20 158 lb 11.7 oz (72 kg)    General: Well nourished, well developed, in no acute distress Head: Atraumatic, normal size  Eyes: PEERLA, EOMI  Neck: Supple, no JVD Endocrine: No thryomegaly Cardiac: Normal S1, S2; RRR; no murmurs, rubs, or gallops Lungs: Clear to auscultation bilaterally, no wheezing, rhonchi or rales  Abd: Soft, nontender, no hepatomegaly  Ext: No edema, pulses 2+ Musculoskeletal: No deformities, BUE and BLE strength normal and equal Skin: Warm and dry, no rashes   Neuro: Alert and oriented to person, place, time, and situation, CNII-XII grossly intact, no focal deficits  Psych: Normal mood and affect   ASSESSMENT:    Natalie Wyatt is a 65 y.o. female who presents for the following: 1. PVC (premature ventricular contraction)     PLAN:   1. PVC (premature ventricular contraction) -Seen several months ago for atypical chest pain and noted to have PVCs.  She is had these for years.  PVC burden 3.7%.  No symptoms.  This does not require any specific treatment.  She had a normal coronary CTA with 0 calcium.  She is low risk for future cardiovascular disease events.  I have recommended diet and exercise.  She should maintain good cardiovascular prevention habits.  Her echocardiogram was normal.  Since her PVCs do not bother her I have not recommended treatment.  We will plan to see her as needed moving forward.  If she notices palpitations or more symptoms she will reach back out  to [place].  Disposition: Return if symptoms worsen or fail to improve.  Medication Adjustments/Labs and Tests Ordered: Current medicines are reviewed at length with the patient today.  Concerns regarding medicines are outlined above.  No orders of the defined types were placed in this encounter.  No orders of the defined types were placed in this encounter.   Patient Instructions  Medication Instructions:  The current medical regimen is effective;  continue present plan and medications.  *If you need a refill on your cardiac medications before your next appointment, please call your pharmacy*  Follow-Up: At [facility], you and your health needs are our priority.  As part of our continuing mission to provide you with exceptional heart care, we have created designated Provider Care Teams.  These Care Teams include your primary Cardiologist (physician) and Advanced Practice Providers (APPs -  Physician Assistants and Nurse Practitioners) who all work together to provide you with the care you need, when you need it.  We recommend signing up for the patient portal called "MyChart".  Sign up information is provided on this After Visit  Summary.  MyChart is used to connect with patients for Virtual Visits (Telemedicine).  Patients are able to view lab/test results, encounter notes, upcoming appointments, etc.  Non-urgent messages can be sent to your provider as well.   To learn more about what you can do with MyChart, go to [email or web address].    Your next appointment:   As needed  The format for your next appointment:   In Person  Provider:   Eleonore Chiquito, MD       Time Spent with Patient: I have spent a total of 25 minutes with patient reviewing hospital notes, telemetry, EKGs, labs and examining the patient as well as establishing an assessment and plan that was discussed with the patient.  > 50% of time was spent in direct patient care.  Signed, Addison Naegeli. Audie Box, MD, [place]  [address] [place], [place] [address] [phone]  01/03/2021 9:33 AM

## 2021-01-03 ENCOUNTER — Other Ambulatory Visit: Payer: Self-pay

## 2021-01-03 ENCOUNTER — Encounter: Payer: Self-pay | Admitting: Cardiovascular Disease

## 2021-01-03 ENCOUNTER — Ambulatory Visit (INDEPENDENT_AMBULATORY_CARE_PROVIDER_SITE_OTHER): Payer: Medicare Other | Admitting: Cardiovascular Disease

## 2021-01-03 VITALS — BP 136/84 | HR 68 | Ht 62.0 in | Wt 156.0 lb

## 2021-01-03 DIAGNOSIS — I493 Ventricular premature depolarization: Secondary | ICD-10-CM | POA: Diagnosis not present

## 2021-01-03 NOTE — Patient Instructions (Signed)
Medication Instructions:  The current medical regimen is effective;  continue present plan and medications.  *If you need a refill on your cardiac medications before your next appointment, please call your pharmacy*    Follow-Up: At [facility], you and your health needs are our priority.  As part of our continuing mission to provide you with exceptional heart care, we have created designated Provider Care Teams.  These Care Teams include your primary Cardiologist (physician) and Advanced Practice Providers (APPs -  Physician Assistants and Nurse Practitioners) who all work together to provide you with the care you need, when you need it.  We recommend signing up for the patient portal called "MyChart".  Sign up information is provided on this After Visit Summary.  MyChart is used to connect with patients for Virtual Visits (Telemedicine).  Patients are able to view lab/test results, encounter notes, upcoming appointments, etc.  Non-urgent messages can be sent to your provider as well.   To learn more about what you can do with MyChart, go to [email or web address].    Your next appointment:   As needed  The format for your next appointment:   In Person  Provider:   Volcano O'Neal, MD      

## 2021-09-13 NOTE — Progress Notes (Signed)
Cardiology Office Note:   Date:  09/14/2021  NAME:  Natalie Wyatt    MRN: [id number] DOB:  [birth year]   PCP:  Delano Metz, MD  Cardiologist:  None  Electrophysiologist:  None   Referring MD: Delano Metz, MD   Chief Complaint  Patient presents with   Follow-up         History of Present Illness:   Natalie Wyatt is a 66 y.o. female with a hx of PVCs who presents for follow-up. Known history of PVCs. Had cholecystectomy and PVCs after the case.  She was told by anesthesia that she had A-fib.  I reviewed the records and EKG.  She had sinus rhythm with PVCs.  Her potassium was low at 3.2.  She is on HCTZ for lower extremity edema.  We discussed this could be contributing to her PVCs.  EKG in office demonstrates sinus rhythm with PVCs.  She wore a monitor in the past with 3.7% PVC burden.  Coronary CTA was normal.  Echo showed normal LV function.  We deferred any treatment as she was without major symptoms.  She reports fluttering in her chest more often.  Symptoms occur daily.  Described as skipped beats.  We discussed treating her PVCs.  She is okay to do this.  I believe stopping her HCTZ will likely help with hypokalemia is likely worsening her PVCs.  She is okay to do this.  No other changes to medical history.  She is very healthy otherwise.  Problem List 1. PVC -3.7% burden -0 coronary calcium -normal CCTA 11/03/2020 -EF 55-60%  Past Medical History: Past Medical History:  Diagnosis Date   Lyme disease, unspecified    PVC (premature ventricular contraction)     Past Surgical History: Past Surgical History:  Procedure Laterality Date   ABDOMINAL HYSTERECTOMY     CESAREAN SECTION     CHOLECYSTECTOMY     HEMORRHOID SURGERY     TONSILLECTOMY      Current Medications: Current Meds  Medication Sig   albuterol (VENTOLIN HFA) 108 (90 Base) MCG/ACT inhaler Inhale into the lungs every 6 (six) hours as needed for wheezing or shortness of breath.   buPROPion (WELLBUTRIN XL) 300  MG 24 hr tablet Take 300 mg by mouth daily.   butalbital-aspirin-caffeine (FIORINAL) 50-325-40 MG capsule Take 1 capsule by mouth 2 (two) times daily as needed for headache.   cyclobenzaprine (FLEXERIL) 5 MG tablet Take 5 mg by mouth 3 (three) times daily as needed for muscle spasms.   EPINEPHrine 0.3 mg/0.3 mL IJ SOAJ injection Inject 0.3 mg into the muscle as needed for anaphylaxis.   estradiol (ESTRACE) 2 MG tablet Take 2 mg by mouth daily.   metoprolol succinate (TOPROL XL) 25 MG 24 hr tablet Take 1 tablet (25 mg total) by mouth daily.   naproxen (NAPROSYN) 500 MG tablet Take 1,000 mg by mouth 2 (two) times daily with a meal.   pantoprazole (PROTONIX) 40 MG tablet Take 40 mg by mouth daily.   [DISCONTINUED] hydrochlorothiazide (HYDRODIURIL) 25 MG tablet Take 12.5 mg by mouth daily.     Allergies:    Peanut-containing drug products, Eggs or egg-derived products, Lactose, Soy allergy, and Wheat bran   Social History: Social History   Socioeconomic History   Marital status: Married    Spouse name: Not on file   Number of children: 2   Years of education: Not on file   Highest education level: Not on file  Occupational History   Occupation: retired school  teacher  Tobacco Use   Smoking status: Never   Smokeless tobacco: Never  Substance and Sexual Activity   Alcohol use: Never   Drug use: Never   Sexual activity: Not on file  Other Topics Concern   Not on file  Social History Narrative   Not on file   Social Determinants of Health   Financial Resource Strain: Not on file  Food Insecurity: Not on file  Transportation Needs: Not on file  Physical Activity: Not on file  Stress: Not on file  Social Connections: Not on file     Family History: The patient's family history includes Heart attack in her father.  ROS:   All other ROS reviewed and negative. Pertinent positives noted in the HPI.     EKGs/Labs/Other Studies Reviewed:   The following studies were personally  reviewed by me today:  EKG:  EKG is ordered today.  The ekg ordered today demonstrates normal sinus rhythm heart rate 76, PVCs noted, and was personally reviewed by me.   TTE 11/24/2020  1. Left ventricular ejection fraction, by estimation, is 55 to 60%. The  left ventricle has normal function. The left ventricle has no regional  wall motion abnormalities. Left ventricular diastolic parameters were  normal.   2. Right ventricular systolic function is normal. The right ventricular  size is normal. There is normal pulmonary artery systolic pressure.   3. The mitral valve is normal in structure. Trivial mitral valve  regurgitation. No evidence of mitral stenosis.   4. The aortic valve is tricuspid. Aortic valve regurgitation is mild. No  aortic stenosis is present.   5. The inferior vena cava is normal in size with greater than 50%  respiratory variability, suggesting right atrial pressure of 3 mmHg.   CCTA 4/7/2022IMPRESSION: 1. Normal coronary calcium score of 0. Patient is low risk for coronary events.   2. Normal coronary origin with right dominance.   3. No evidence of CAD.  Recent Labs: 10/17/2020: BUN 11; Creatinine, Ser 0.77; Hemoglobin 14.1; Platelets 343; Potassium 3.5; Sodium 141; TSH 3.110   Recent Lipid Panel No results found for: CHOL, TRIG, HDL, CHOLHDL, VLDL, LDLCALC, LDLDIRECT  Physical Exam:   VS:  BP 131/78    Pulse 76    Ht 5\' 2"  (1.575 m)    Wt 153 lb 9.6 oz (69.7 kg)    SpO2 99%    BMI 28.09 kg/m    Wt Readings from Last 3 Encounters:  09/14/21 153 lb 9.6 oz (69.7 kg)  01/03/21 156 lb (70.8 kg)  10/17/20 158 lb (71.7 kg)    General: Well nourished, well developed, in no acute distress Head: Atraumatic, normal size  Eyes: PEERLA, EOMI  Neck: Supple, no JVD Endocrine: No thryomegaly Cardiac: Normal S1, S2; RRR; no murmurs, rubs, or gallops Lungs: Clear to auscultation bilaterally, no wheezing, rhonchi or rales  Abd: Soft, nontender, no hepatomegaly  Ext:  No edema, pulses 2+ Musculoskeletal: No deformities, BUE and BLE strength normal and equal Skin: Warm and dry, no rashes   Neuro: Alert and oriented to person, place, time, and situation, CNII-XII grossly intact, no focal deficits  Psych: Normal mood and affect   ASSESSMENT:   Natalie Wyatt is a 66 y.o. female who presents for the following: 1. PVC (premature ventricular contraction)     PLAN:   1. PVC (premature ventricular contraction) -Evaluated in 2022 for PVCs.  PVC burden 3.7%.  She was not having symptoms and her ejection fraction was normal.  Coronary CTA was negative.  We opted for conservative approach.  Seems to be having more PVCs.  I suspect this is worsened by her hypokalemia in the setting of HCTZ use.  We will stop HCTZ and start metoprolol succinate 25 mg daily.  Thyroid studies have been normal recently.  All of her other lab work really unremarkable.  We will plan to see her back in 3 months to reassess symptoms.  If she continues to have then we will recheck a monitor and likely recheck an echocardiogram.  All of her testing in the past was unremarkable.  Disposition: Return in about 3 months (around 12/12/2021).  Medication Adjustments/Labs and Tests Ordered: Current medicines are reviewed at length with the patient today.  Concerns regarding medicines are outlined above.  No orders of the defined types were placed in this encounter.  Meds ordered this encounter  Medications   metoprolol succinate (TOPROL XL) 25 MG 24 hr tablet    Sig: Take 1 tablet (25 mg total) by mouth daily.    Dispense:  90 tablet    Refill:  1    Patient Instructions  Medication Instructions:  STOP Hydrochlorothiazide  START Metoprolol Succinate 25 mg daily   *If you need a refill on your cardiac medications before your next appointment, please call your pharmacy*   Follow-Up: At [facility], you and your health needs are our priority.  As part of our continuing mission to provide you  with exceptional heart care, we have created designated Provider Care Teams.  These Care Teams include your primary Cardiologist (physician) and Advanced Practice Providers (APPs -  Physician Assistants and Nurse Practitioners) who all work together to provide you with the care you need, when you need it.  We recommend signing up for the patient portal called "MyChart".  Sign up information is provided on this After Visit Summary.  MyChart is used to connect with patients for Virtual Visits (Telemedicine).  Patients are able to view lab/test results, encounter notes, upcoming appointments, etc.  Non-urgent messages can be sent to your provider as well.   To learn more about what you can do with MyChart, go to [email or web address].    Your next appointment:   3 month(s)  The format for your next appointment:   In Person  Provider:   Eleonore Chiquito, MD       Time Spent with Patient: I have spent a total of 35 minutes with patient reviewing hospital notes, telemetry, EKGs, labs and examining the patient as well as establishing an assessment and plan that was discussed with the patient.  > 50% of time was spent in direct patient care.  Signed, Addison Naegeli. Audie Box, MD, [place]  [address], [place], [place] [address] [phone]  09/14/2021 3:37 PM

## 2021-09-14 ENCOUNTER — Ambulatory Visit (INDEPENDENT_AMBULATORY_CARE_PROVIDER_SITE_OTHER): Payer: Medicare Other | Admitting: Cardiovascular Disease

## 2021-09-14 ENCOUNTER — Other Ambulatory Visit: Payer: Self-pay

## 2021-09-14 ENCOUNTER — Encounter: Payer: Self-pay | Admitting: Cardiovascular Disease

## 2021-09-14 VITALS — BP 131/78 | HR 76 | Ht 62.0 in | Wt 153.6 lb

## 2021-09-14 DIAGNOSIS — I493 Ventricular premature depolarization: Secondary | ICD-10-CM | POA: Diagnosis not present

## 2021-09-14 MED ORDER — METOPROLOL SUCCINATE ER 25 MG PO TB24: 25.0000 mg | ORAL_TABLET | Freq: Every day | ORAL | 1 refills | Status: DC

## 2021-09-14 NOTE — Patient Instructions (Signed)
Medication Instructions:  STOP Hydrochlorothiazide  START Metoprolol Succinate 25 mg daily   *If you need a refill on your cardiac medications before your next appointment, please call your pharmacy*   Follow-Up: At [facility], you and your health needs are our priority.  As part of our continuing mission to provide you with exceptional heart care, we have created designated Provider Care Teams.  These Care Teams include your primary Cardiologist (physician) and Advanced Practice Providers (APPs -  Physician Assistants and Nurse Practitioners) who all work together to provide you with the care you need, when you need it.  We recommend signing up for the patient portal called "MyChart".  Sign up information is provided on this After Visit Summary.  MyChart is used to connect with patients for Virtual Visits (Telemedicine).  Patients are able to view lab/test results, encounter notes, upcoming appointments, etc.  Non-urgent messages can be sent to your provider as well.   To learn more about what you can do with MyChart, go to [email or web address].    Your next appointment:   3 month(s)  The format for your next appointment:   In Person  Provider:   Eleonore Chiquito, MD

## 2021-10-27 ENCOUNTER — Ambulatory Visit: Payer: Medicare Other | Admitting: Adult Health

## 2021-11-01 ENCOUNTER — Ambulatory Visit (INDEPENDENT_AMBULATORY_CARE_PROVIDER_SITE_OTHER): Payer: Medicare Other | Admitting: Dermatology

## 2021-11-01 DIAGNOSIS — Z1283 Encounter for screening for malignant neoplasm of skin: Secondary | ICD-10-CM | POA: Diagnosis not present

## 2021-11-01 DIAGNOSIS — D229 Melanocytic nevi, unspecified: Secondary | ICD-10-CM

## 2021-11-01 DIAGNOSIS — D1801 Hemangioma of skin and subcutaneous tissue: Secondary | ICD-10-CM | POA: Diagnosis not present

## 2021-11-01 DIAGNOSIS — L578 Other skin changes due to chronic exposure to nonionizing radiation: Secondary | ICD-10-CM | POA: Diagnosis not present

## 2021-11-01 DIAGNOSIS — L821 Other seborrheic keratosis: Secondary | ICD-10-CM

## 2021-11-01 DIAGNOSIS — Z85828 Personal history of other malignant neoplasm of skin: Secondary | ICD-10-CM | POA: Diagnosis not present

## 2021-11-01 DIAGNOSIS — L570 Actinic keratosis: Secondary | ICD-10-CM | POA: Diagnosis not present

## 2021-11-01 DIAGNOSIS — L814 Other melanin hyperpigmentation: Secondary | ICD-10-CM

## 2021-11-01 NOTE — Progress Notes (Signed)
? ?New Patient Visit ? ?Subjective  ?Natalie Wyatt is a 66 y.o. female who presents for the following: Annual Exam (New patient - History of BCC excised 08/10/2021 - TBSE today). ?The patient presents for Total-Body Skin Exam (TBSE) for skin cancer screening and mole check.  The patient has spots, moles and lesions to be evaluated, some may be new or changing and the patient has concerns that these could be cancer. ? ?The following portions of the chart were reviewed this encounter and updated as appropriate:  ? Tobacco  Allergies  Meds  Problems  Med Hx  Surg Hx  Fam Hx   ?  ?Review of Systems:  No other skin or systemic complaints except as noted in HPI or Assessment and Plan. ? ?Objective  ?Well appearing patient in no apparent distress; mood and affect are within normal limits. ? ?A full examination was performed including scalp, head, eyes, ears, nose, lips, neck, chest, axillae, abdomen, back, buttocks, bilateral upper extremities, bilateral lower extremities, hands, feet, fingers, toes, fingernails, and toenails. All findings within normal limits unless otherwise noted below. ? ?Face (5) ?Erythematous thin papules/macules with gritty scale.  ? ?Right Thigh - Posterior ?Purple papule ? ? ?Assessment & Plan  ? ?History of Basal Cell Carcinoma of the Skin ?- No evidence of recurrence today ?- Recommend regular full body skin exams ?- Recommend daily broad spectrum sunscreen SPF 30+ to sun-exposed areas, reapply every 2 hours as needed.  ?- Call if any new or changing lesions are noted between office visits ? ?Lentigines ?- Scattered tan macules ?- Due to sun exposure ?- Benign-appearing, observe ?- Recommend daily broad spectrum sunscreen SPF 30+ to sun-exposed areas, reapply every 2 hours as needed. ?- Call for any changes ? ?Seborrheic Keratoses ?- Stuck-on, waxy, tan-Schneiderman papules and/or plaques  ?- Benign-appearing ?- Discussed benign etiology and prognosis. ?- Observe ?- Call for any  changes ? ?Melanocytic Nevi ?- Tan-Tarr and/or pink-flesh-colored symmetric macules and papules ?- Benign appearing on exam today ?- Observation ?- Call clinic for new or changing moles ?- Recommend daily use of broad spectrum spf 30+ sunscreen to sun-exposed areas.  ? ?Hemangiomas ?- Red papules ?- Discussed benign nature ?- Observe ?- Call for any changes ? ?Actinic Damage ?- Chronic condition, secondary to cumulative UV/sun exposure ?- diffuse scaly erythematous macules with underlying dyspigmentation ?- Recommend daily broad spectrum sunscreen SPF 30+ to sun-exposed areas, reapply every 2 hours as needed.  ?- Staying in the shade or wearing long sleeves, sun glasses (UVA+UVB protection) and wide brim hats (4-inch brim around the entire circumference of the hat) are also recommended for sun protection.  ?- Call for new or changing lesions. ? ?Skin cancer screening performed today. ? ?AK (actinic keratosis) (5) ?Face ? ?Destruction of lesion - Face ?Complexity: simple   ?Destruction method: cryotherapy   ?Informed consent: discussed and consent obtained   ?Timeout:  patient name, date of birth, surgical site, and procedure verified ?Lesion destroyed using liquid nitrogen: Yes   ?Region frozen until ice ball extended beyond lesion: Yes   ?Outcome: patient tolerated procedure well with no complications   ?Post-procedure details: wound care instructions given   ? ?Hemangioma of skin ?Right Thigh - Posterior ? ?Benign appearing under dioscopy ? ?Skin cancer screening ? ? ?Return in about 6 months (around 05/03/2022). ? ?I, Ashok Cordia, CMA, am acting as scribe for Sarina Ser, MD . ?Documentation: I have reviewed the above documentation for accuracy and completeness, and I agree with  the above. ? ?Sarina Ser, MD ? ?

## 2021-11-01 NOTE — Patient Instructions (Signed)
Cryotherapy Aftercare ? ?Wash gently with soap and water everyday.   ?Apply Vaseline and Band-Aid daily until healed.  ? ?Wound Care Instructions ? ?Cleanse wound gently with soap and water once a day then pat dry with clean gauze. Apply a thing coat of Petrolatum (petroleum jelly, "Vaseline") over the wound (unless you have an allergy to this). We recommend that you use a new, sterile tube of Vaseline. Do not pick or remove scabs. Do not remove the yellow or white "healing tissue" from the base of the wound. ? ?Cover the wound with fresh, clean, nonstick gauze and secure with paper tape. You may use Band-Aids in place of gauze and tape if the would is small enough, but would recommend trimming much of the tape off as there is often too much. Sometimes Band-Aids can irritate the skin. ? ?You should call the office for your biopsy report after 1 week if you have not already been contacted. ? ?If you experience any problems, such as abnormal amounts of bleeding, swelling, significant bruising, significant pain, or evidence of infection, please call the office immediately. ? ?FOR ADULT SURGERY PATIENTS: If you need something for pain relief you may take 1 extra strength Tylenol (acetaminophen) AND 2 Ibuprofen ('200mg'$  each) together every 4 hours as needed for pain. (do not take these if you are allergic to them or if you have a reason you should not take them.) Typically, you may only need pain medication for 1 to 3 days.  ? ? ?

## 2021-11-03 ENCOUNTER — Encounter: Payer: Self-pay | Admitting: Dermatology

## 2021-12-12 ENCOUNTER — Encounter: Payer: Self-pay | Admitting: Cardiovascular Disease

## 2021-12-12 ENCOUNTER — Ambulatory Visit (INDEPENDENT_AMBULATORY_CARE_PROVIDER_SITE_OTHER): Payer: Medicare Other | Admitting: Cardiovascular Disease

## 2021-12-12 VITALS — BP 115/72 | HR 83 | Ht 62.0 in | Wt 161.2 lb

## 2021-12-12 DIAGNOSIS — I493 Ventricular premature depolarization: Secondary | ICD-10-CM | POA: Diagnosis not present

## 2021-12-12 NOTE — Patient Instructions (Signed)

## 2021-12-12 NOTE — Progress Notes (Signed)
?Cardiology Office Note:   ?Date:  12/12/2021  ?NAME:  Natalie Wyatt    ?MRN: [id number] ?DOB:  1955/12/07  ? ?PCP:  Delano Metz, MD  ?Cardiologist:  None  ?Electrophysiologist:  None  ? ?Referring MD: Delano Metz, MD  ? ?Chief Complaint  ?Patient presents with  ? Follow-up  ?   ?  ? ?History of Present Illness:   ?Natalie Wyatt is a 66 y.o. female with a hx of PVCs who presents for follow-up. Started on metoprolol for worsening palpitations.  Symptoms have improved on metoprolol.  No PVCs heard on examination today.  Denies any chest pain or trouble breathing.  Overall doing quite well.  Most recent echo was normal.  Coronary CT normal.  Seems to be doing well.  I have encouraged her to get more active.  Blood pressure is well controlled.  Without complaints today. ? ?Problem List ?1. PVC ?-3.7% burden ?-0 coronary calcium ?-normal CCTA 11/03/2020 ?-EF 55-60% ? ?Past Medical History: ?Past Medical History:  ?Diagnosis Date  ? Lyme disease, unspecified   ? PVC (premature ventricular contraction)   ? ? ?Past Surgical History: ?Past Surgical History:  ?Procedure Laterality Date  ? ABDOMINAL HYSTERECTOMY    ? CESAREAN SECTION    ? CHOLECYSTECTOMY    ? HEMORRHOID SURGERY    ? TONSILLECTOMY    ? ? ?Current Medications: ?Current Meds  ?Medication Sig  ? albuterol (VENTOLIN HFA) 108 (90 Base) MCG/ACT inhaler Inhale into the lungs every 6 (six) hours as needed for wheezing or shortness of breath.  ? buPROPion (WELLBUTRIN XL) 300 MG 24 hr tablet Take 300 mg by mouth daily.  ? butalbital-aspirin-caffeine (FIORINAL) 50-325-40 MG capsule Take 1 capsule by mouth 2 (two) times daily as needed for headache.  ? cyclobenzaprine (FLEXERIL) 5 MG tablet Take 5 mg by mouth 3 (three) times daily as needed for muscle spasms.  ? EPINEPHrine 0.3 mg/0.3 mL IJ SOAJ injection Inject 0.3 mg into the muscle as needed for anaphylaxis.  ? estradiol (ESTRACE) 2 MG tablet Take 2 mg by mouth daily.  ? fesoterodine (TOVIAZ) 4 MG TB24 tablet Take by  mouth.  ? metoprolol succinate (TOPROL XL) 25 MG 24 hr tablet Take 1 tablet (25 mg total) by mouth daily.  ? naproxen (NAPROSYN) 500 MG tablet Take 1,000 mg by mouth 2 (two) times daily with a meal.  ? pantoprazole (PROTONIX) 40 MG tablet Take 40 mg by mouth daily.  ?  ? ?Allergies:    ?Peanut-containing drug products, Eggs or egg-derived products, Lactose, Soy allergy, and Wheat bran  ? ?Social History: ?Social History  ? ?Socioeconomic History  ? Marital status: Married  ?  Spouse name: Not on file  ? Number of children: 2  ? Years of education: Not on file  ? Highest education level: Not on file  ?Occupational History  ? Occupation: retired Education officer, museum  ?Tobacco Use  ? Smoking status: Never  ? Smokeless tobacco: Never  ?Substance and Sexual Activity  ? Alcohol use: Never  ? Drug use: Never  ? Sexual activity: Not on file  ?Other Topics Concern  ? Not on file  ?Social History Narrative  ? Not on file  ? ?Social Determinants of Health  ? ?Financial Resource Strain: Not on file  ?Food Insecurity: Not on file  ?Transportation Needs: Not on file  ?Physical Activity: Not on file  ?Stress: Not on file  ?Social Connections: Not on file  ?  ? ?Family History: ?The patient's family history includes  Heart attack in her father. ? ?ROS:   ?All other ROS reviewed and negative. Pertinent positives noted in the HPI.    ? ?EKGs/Labs/Other Studies Reviewed:   ?The following studies were personally reviewed by me today: ? ?CCTA 11/03/2020 ?IMPRESSION: ?1. Normal coronary calcium score of 0. Patient is low risk for ?coronary events. ?  ?2. Normal coronary origin with right dominance. ?  ?3. No evidence of CAD. ?  ?4. CAD-RADS 0. Consider non-atherosclerotic causes of chest pain. ? ?TTE 11/24/2020 ? 1. Left ventricular ejection fraction, by estimation, is 55 to 60%. The  ?left ventricle has normal function. The left ventricle has no regional  ?wall motion abnormalities. Left ventricular diastolic parameters were  ?normal.  ? 2. Right  ventricular systolic function is normal. The right ventricular  ?size is normal. There is normal pulmonary artery systolic pressure.  ? 3. The mitral valve is normal in structure. Trivial mitral valve  ?regurgitation. No evidence of mitral stenosis.  ? 4. The aortic valve is tricuspid. Aortic valve regurgitation is mild. No  ?aortic stenosis is present.  ? 5. The inferior vena cava is normal in size with greater than 50%  ?respiratory variability, suggesting right atrial pressure of 3 mmHg. ? ?Recent Labs: ?No results found for requested labs within last 8760 hours.  ? ?Recent Lipid Panel ?No results found for: CHOL, TRIG, HDL, CHOLHDL, VLDL, LDLCALC, LDLDIRECT ? ?Physical Exam:   ?VS:  BP 115/72   Pulse 83   Ht '5\' 2"'$  (1.575 m)   Wt 161 lb 3.2 oz (73.1 kg)   SpO2 98%   BMI 29.48 kg/m?    ?Wt Readings from Last 3 Encounters:  ?12/12/21 161 lb 3.2 oz (73.1 kg)  ?09/14/21 153 lb 9.6 oz (69.7 kg)  ?01/03/21 156 lb (70.8 kg)  ?  ?General: Well nourished, well developed, in no acute distress ?Head: Atraumatic, normal size  ?Eyes: PEERLA, EOMI  ?Neck: Supple, no JVD ?Endocrine: No thryomegaly ?Cardiac: Normal S1, S2; RRR; no murmurs, rubs, or gallops ?Lungs: Clear to auscultation bilaterally, no wheezing, rhonchi or rales  ?Abd: Soft, nontender, no hepatomegaly  ?Ext: No edema, pulses 2+ ?Musculoskeletal: No deformities, BUE and BLE strength normal and equal ?Skin: Warm and dry, no rashes   ?Neuro: Alert and oriented to person, place, time, and situation, CNII-XII grossly intact, no focal deficits  ?Psych: Normal mood and affect  ? ?ASSESSMENT:   ?Natalie Wyatt is a 66 y.o. female who presents for the following: ?1. PVC (premature ventricular contraction)   ? ? ?PLAN:   ?1. PVC (premature ventricular contraction) ?-Denies any chest pain or trouble breathing.  PVC 3.7% burden.  Echo normal.  Coronary CTA normal.  Started on metoprolol succinate 25 mg daily with improvement in symptoms.  She has no further symptoms.  We  will continue metoprolol.  She will see [place] yearly. ? ? ?  ? ?Disposition: Return in about 1 year (around 12/13/2022). ? ?Medication Adjustments/Labs and Tests Ordered: ?Current medicines are reviewed at length with the patient today.  Concerns regarding medicines are outlined above.  ?No orders of the defined types were placed in this encounter. ? ?No orders of the defined types were placed in this encounter. ? ? ?Patient Instructions  ?Medication Instructions:  ?The current medical regimen is effective;  continue present plan and medications. ? ?*If you need a refill on your cardiac medications before your next appointment, please call your pharmacy* ? ?Follow-Up: ?At [facility], you and your health needs are  our priority.  As part of our continuing mission to provide you with exceptional heart care, we have created designated Provider Care Teams.  These Care Teams include your primary Cardiologist (physician) and Advanced Practice Providers (APPs -  Physician Assistants and Nurse Practitioners) who all work together to provide you with the care you need, when you need it. ? ?We recommend signing up for the patient portal called "MyChart".  Sign up information is provided on this After Visit Summary.  MyChart is used to connect with patients for Virtual Visits (Telemedicine).  Patients are able to view lab/test results, encounter notes, upcoming appointments, etc.  Non-urgent messages can be sent to your provider as well.   ?To learn more about what you can do with MyChart, go to [email or web address].   ? ?Your next appointment:   ?12 month(s) ? ?The format for your next appointment:   ?In Person ? ?Provider:   ?Eleonore Chiquito, MD  ? ? ? ? ? ? ?  ? ?Time Spent with Patient: I have spent a total of 25 minutes with patient reviewing hospital notes, telemetry, EKGs, labs and examining the patient as well as establishing an assessment and plan that was discussed with the patient.  > 50% of time was spent in direct  patient care. ? ?Signed, ?[place] T. Audie Box, MD, [facility] ?[place] [place]  ?[address], [address] ?[place], [place] [address] ?([phone]  ?12/12/2021 2:46 PM    ? ?

## 2022-03-15 ENCOUNTER — Other Ambulatory Visit: Payer: Self-pay | Admitting: *Deleted

## 2022-03-15 MED ORDER — METOPROLOL SUCCINATE ER 25 MG PO TB24: 25.0000 mg | ORAL_TABLET | Freq: Every day | ORAL | 3 refills | Status: DC

## 2022-05-03 ENCOUNTER — Ambulatory Visit (INDEPENDENT_AMBULATORY_CARE_PROVIDER_SITE_OTHER): Payer: Medicare Other | Admitting: Dermatology

## 2022-05-03 DIAGNOSIS — L820 Inflamed seborrheic keratosis: Secondary | ICD-10-CM

## 2022-05-03 DIAGNOSIS — L570 Actinic keratosis: Secondary | ICD-10-CM

## 2022-05-03 DIAGNOSIS — L720 Epidermal cyst: Secondary | ICD-10-CM

## 2022-05-03 DIAGNOSIS — L578 Other skin changes due to chronic exposure to nonionizing radiation: Secondary | ICD-10-CM

## 2022-05-03 DIAGNOSIS — L821 Other seborrheic keratosis: Secondary | ICD-10-CM

## 2022-05-03 DIAGNOSIS — L814 Other melanin hyperpigmentation: Secondary | ICD-10-CM | POA: Diagnosis not present

## 2022-05-03 NOTE — Patient Instructions (Signed)
Cryotherapy Aftercare  Wash gently with soap and water everyday.   Apply Vaseline and Band-Aid daily until healed.     Due to recent changes in healthcare laws, you may see results of your pathology and/or laboratory studies on MyChart before the doctors have had a chance to review them. We understand that in some cases there may be results that are confusing or concerning to you. Please understand that not all results are received at the same time and often the doctors may need to interpret multiple results in order to provide you with the best plan of care or course of treatment. Therefore, we ask that you please give us 2 business days to thoroughly review all your results before contacting the office for clarification. Should we see a critical lab result, you will be contacted sooner.   If You Need Anything After Your Visit  If you have any questions or concerns for your doctor, please call our main line at [phone] and press option 4 to reach your doctor's medical assistant. If no one answers, please leave a voicemail as directed and we will return your call as soon as possible. Messages left after 4 pm will be answered the following business day.   You may also send us a message via MyChart. We typically respond to MyChart messages within 1-2 business days.  For prescription refills, please ask your pharmacy to contact our office. Our fax number is [phone].  If you have an urgent issue when the clinic is closed that cannot wait until the next business day, you can page your doctor at the number below.    Please note that while we do our best to be available for urgent issues outside of office hours, we are not available 24/7.   If you have an urgent issue and are unable to reach us, you may choose to seek medical care at your doctor's office, retail clinic, urgent care center, or emergency room.  If you have a medical emergency, please immediately call 911 or go to the  emergency department.  Pager Numbers  - Dr. Kowalski: [phone]  - Dr. Moye: [phone]  - Dr. Stewart: [phone]  In the event of inclement weather, please call our main line at [phone] for an update on the status of any delays or closures.  Dermatology Medication Tips: Please keep the boxes that topical medications come in in order to help keep track of the instructions about where and how to use these. Pharmacies typically print the medication instructions only on the boxes and not directly on the medication tubes.   If your medication is too expensive, please contact our office at [phone] option 4 or send us a message through MyChart.   We are unable to tell what your co-pay for medications will be in advance as this is different depending on your insurance coverage. However, we may be able to find a substitute medication at lower cost or fill out paperwork to get insurance to cover a needed medication.   If a prior authorization is required to get your medication covered by your insurance company, please allow us 1-2 business days to complete this process.  Drug prices often vary depending on where the prescription is filled and some pharmacies may offer cheaper prices.  The website www.goodrx.com contains coupons for medications through different pharmacies. The prices here do not account for what the cost may be with help from insurance (it may be cheaper with your insurance), but the website can   give you the price if you did not use any insurance.  - You can print the associated coupon and take it with your prescription to the pharmacy.  - You may also stop by our office during regular business hours and pick up a GoodRx coupon card.  - If you need your prescription sent electronically to a different pharmacy, notify our office through [facility] MyChart or by phone at [phone] option 4.     Si Usted Necesita Algo Despus de Su Visita  Tambin puede  enviarnos un mensaje a travs de MyChart. Por lo general respondemos a los mensajes de MyChart en el transcurso de 1 a 2 das hbiles.  Para renovar recetas, por favor pida a su farmacia que se ponga en contacto con nuestra oficina. Nuestro nmero de fax es el [phone].  Si tiene un asunto urgente cuando la clnica est cerrada y que no puede esperar hasta el siguiente da hbil, puede llamar/localizar a su doctor(a) al nmero que aparece a continuacin.   Por favor, tenga en cuenta que aunque hacemos todo lo posible para estar disponibles para asuntos urgentes fuera del horario de oficina, no estamos disponibles las 24 horas del da, los 7 das de la semana.   Si tiene un problema urgente y no puede comunicarse con nosotros, puede optar por buscar atencin mdica  en el consultorio de su doctor(a), en una clnica privada, en un centro de atencin urgente o en una sala de emergencias.  Si tiene una emergencia mdica, por favor llame inmediatamente al 911 o vaya a la sala de emergencias.  Nmeros de bper  - Dr. Kowalski: [phone]  - Dra. Moye: [phone]  - Dra. Stewart: [phone]  En caso de inclemencias del tiempo, por favor llame a nuestra lnea principal al [phone] para una actualizacin sobre el estado de cualquier retraso o cierre.  Consejos para la medicacin en dermatologa: Por favor, guarde las cajas en las que vienen los medicamentos de uso tpico para ayudarle a seguir las instrucciones sobre dnde y cmo usarlos. Las farmacias generalmente imprimen las instrucciones del medicamento slo en las cajas y no directamente en los tubos del medicamento.   Si su medicamento es muy caro, por favor, pngase en contacto con nuestra oficina llamando al [phone] y presione la opcin 4 o envenos un mensaje a travs de MyChart.   No podemos decirle cul ser su copago por los medicamentos por adelantado ya que esto es diferente dependiendo de la cobertura de su seguro.  Sin embargo, es posible que podamos encontrar un medicamento sustituto a menor costo o llenar un formulario para que el seguro cubra el medicamento que se considera necesario.   Si se requiere una autorizacin previa para que su compaa de seguros cubra su medicamento, por favor permtanos de 1 a 2 das hbiles para completar este proceso.  Los precios de los medicamentos varan con frecuencia dependiendo del lugar de dnde se surte la receta y alguna farmacias pueden ofrecer precios ms baratos.  El sitio web www.goodrx.com tiene cupones para medicamentos de diferentes farmacias. Los precios aqu no tienen en cuenta lo que podra costar con la ayuda del seguro (puede ser ms barato con su seguro), pero el sitio web puede darle el precio si no utiliz ningn seguro.  - Puede imprimir el cupn correspondiente y llevarlo con su receta a la farmacia.  - Tambin puede pasar por nuestra oficina durante el horario de atencin regular y recoger una tarjeta de cupones de GoodRx.  -   Si necesita que su receta se enve electrnicamente a una farmacia diferente, informe a nuestra oficina a travs de MyChart de [place] o por telfono llamando al [phone] y presione la opcin 4.  

## 2022-05-03 NOTE — Progress Notes (Signed)
   Follow-Up Visit   Subjective  Natalie Wyatt is a 66 y.o. female who presents for the following: Actinic Keratosis (6 month follow up face treated with LN2 x 5). The patient has spots, moles and lesions to be evaluated, some may be new or changing and the patient has concerns that these could be cancer.  The following portions of the chart were reviewed this encounter and updated as appropriate:   Tobacco  Allergies  Meds  Problems  Med Hx  Surg Hx  Fam Hx     Review of Systems:  No other skin or systemic complaints except as noted in HPI or Assessment and Plan.  Objective  Well appearing patient in no apparent distress; mood and affect are within normal limits.  A focused examination was performed including face. Relevant physical exam findings are noted in the Assessment and Plan.  Right nose Smooth white papule(s).   Right Forehead Erythematous stuck-on, waxy papule or plaque   Assessment & Plan   Seborrheic Keratoses - Stuck-on, waxy, tan-Strey papules and/or plaques  - Benign-appearing - Discussed benign etiology and prognosis. - Observe - Call for any changes  Lentigines - Scattered tan macules - Due to sun exposure - Benign-appearing, observe - Recommend daily broad spectrum sunscreen SPF 30+ to sun-exposed areas, reapply every 2 hours as needed. - Call for any changes  Actinic Damage - chronic, secondary to cumulative UV radiation exposure/sun exposure over time - diffuse scaly erythematous macules with underlying dyspigmentation - Recommend daily broad spectrum sunscreen SPF 30+ to sun-exposed areas, reapply every 2 hours as needed.  - Recommend staying in the shade or wearing long sleeves, sun glasses (UVA+UVB protection) and wide brim hats (4-inch brim around the entire circumference of the hat). - Call for new or changing lesions.  Mil[place] Right nose  Benign-appearing.  Observation.  Call clinic for new or changing lesions.  Recommend daily use of  broad spectrum spf 30+ sunscreen to sun-exposed areas.    Inflamed seborrheic keratosis Right Forehead  Destruction of lesion - Right Forehead Complexity: simple   Destruction method: cryotherapy   Informed consent: discussed and consent obtained   Timeout:  patient name, date of birth, surgical site, and procedure verified Lesion destroyed using liquid nitrogen: Yes   Region frozen until ice ball extended beyond lesion: Yes   Outcome: patient tolerated procedure well with no complications   Post-procedure details: wound care instructions given    AK (actinic keratosis) (2) Right nose x 1, right nasal tip x 1  Destruction of lesion - Right nose x 1, right nasal tip x 1 Complexity: simple   Destruction method: cryotherapy   Informed consent: discussed and consent obtained   Timeout:  patient name, date of birth, surgical site, and procedure verified Lesion destroyed using liquid nitrogen: Yes   Region frozen until ice ball extended beyond lesion: Yes   Outcome: patient tolerated procedure well with no complications   Post-procedure details: wound care instructions given     Return in about 3 months (around 08/03/2022).  I, Ashok Cordia, CMA, am acting as scribe for Sarina Ser, MD . Documentation: I have reviewed the above documentation for accuracy and completeness, and I agree with the above.  Sarina Ser, MD

## 2022-05-08 ENCOUNTER — Encounter: Payer: Self-pay | Admitting: Dermatology

## 2022-08-06 ENCOUNTER — Ambulatory Visit: Payer: Medicare Other | Admitting: Dermatology

## 2022-10-22 IMAGING — CT CT HEART MORP W/ CTA COR W/ SCORE W/ CA W/CM &/OR W/O CM
1 of 17 series · 2 of 20 positions shown, 3 images · non-contrast
Comparison: None.

Addendum:
CLINICAL DATA: Chest pain

EXAM:
Cardiac/Coronary  CTA
TECHNIQUE: The patient was scanned on a Siemens Somatoform go.Top scanner.

[Series 45: ms multiphase cta coronary 0.60 · axial · 0.32mm/px · z∈[-1077,-1036]mm · 2 of 2799 slices shown, 3 images]
[im 933/2799  vessel]
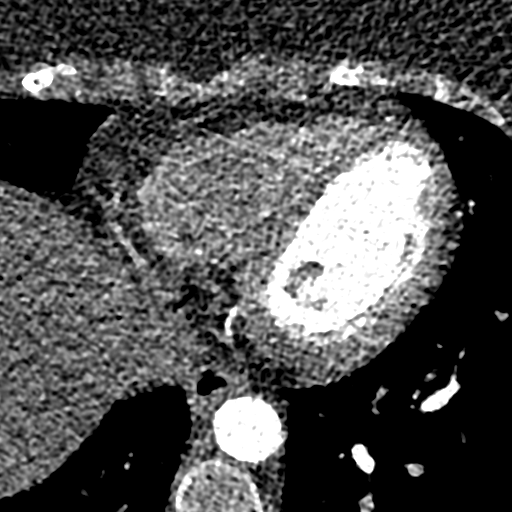
[im 933/2799  lung]
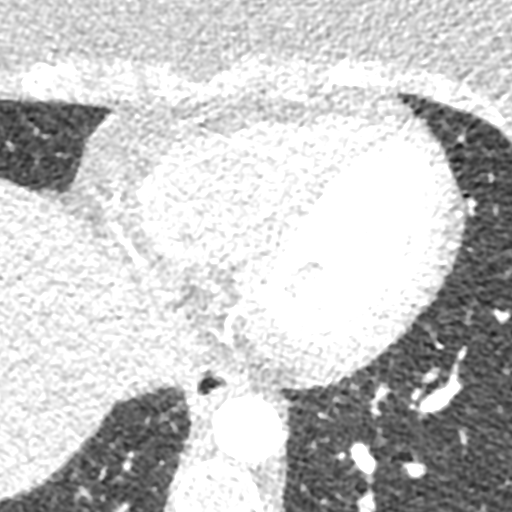
[im 1866/2799  vessel]
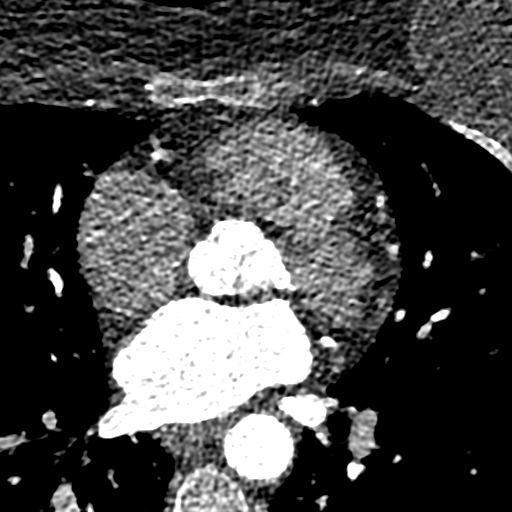

[2 of 20 positions shown; findings below may reference images not displayed]

FINDINGS: A retrospective scan was triggered in the descending thoracic aorta.
Axial non-contrast 3 mm slices were carried out through the heart.
The data set was analyzed on a dedicated work station and scored
using the Agatson method. Gantry rotation speed was 330 msecs and
collimation was .6 mm. 100mg of metoprolol and 0.8 mg of sl NTG was
given. The 3D data set was reconstructed in 5% intervals of the
60-95 % of the R-R cycle. Diastolic phases were analyzed on a
dedicated work station using MPR, MIP and VRT modes. The patient
received 75 cc of contrast.

Aorta:  Normal size.  No calcifications.  No dissection.

Aortic Valve:  Trileaflet.  No calcifications.

Coronary Arteries:  Normal coronary origin.  Right dominance.

RCA is a dominant artery that gives rise to PDA and PLA. There is no
plaque.

Left main is a large artery that gives rise to LAD and LCX arteries.

LAD  has no plaque.

LCX is a non-dominant artery that gives rise to two obtuse marginal
branches. There is no plaque.

Other findings:

Normal pulmonary vein drainage into the left atrium.

Normal left atrial appendage without a thrombus.

Normal size of the pulmonary artery.
IMPRESSION: 1. Normal coronary calcium score of 0. Patient is low risk for
coronary events.

2. Normal coronary origin with right dominance.

3. No evidence of CAD.

4. CAD-RADS 0. Consider non-atherosclerotic causes of chest pain.

EXAM:
OVER-READ INTERPRETATION  CT CHEST

The following report is an over-read performed by radiologist Dr.
does not include interpretation of cardiac or coronary anatomy or
pathology. The coronary calcium score/coronary CTA interpretation by
the cardiologist is attached.
FINDINGS: The visualized portions of the lower lung fields show no suspicious
nodules, masses, or infiltrates. No pleural fluid seen.

The visualized portions of the mediastinum and chest wall are
unremarkable.
IMPRESSION: No significant non-cardiovascular abnormality in visualized portion
of the thorax.

*** End of Addendum ***
FINDINGS: A retrospective scan was triggered in the descending thoracic aorta.
Axial non-contrast 3 mm slices were carried out through the heart.
The data set was analyzed on a dedicated work station and scored
using the Agatson method. Gantry rotation speed was 330 msecs and
collimation was .6 mm. 100mg of metoprolol and 0.8 mg of sl NTG was
given. The 3D data set was reconstructed in 5% intervals of the
60-95 % of the R-R cycle. Diastolic phases were analyzed on a
dedicated work station using MPR, MIP and VRT modes. The patient
received 75 cc of contrast.

Aorta:  Normal size.  No calcifications.  No dissection.

Aortic Valve:  Trileaflet.  No calcifications.

Coronary Arteries:  Normal coronary origin.  Right dominance.

RCA is a dominant artery that gives rise to PDA and PLA. There is no
plaque.

Left main is a large artery that gives rise to LAD and LCX arteries.

LAD  has no plaque.

LCX is a non-dominant artery that gives rise to two obtuse marginal
branches. There is no plaque.

Other findings:

Normal pulmonary vein drainage into the left atrium.

Normal left atrial appendage without a thrombus.

Normal size of the pulmonary artery.
IMPRESSION: 1. Normal coronary calcium score of 0. Patient is low risk for
coronary events.

2. Normal coronary origin with right dominance.

3. No evidence of CAD.

4. CAD-RADS 0. Consider non-atherosclerotic causes of chest pain.

## 2022-12-16 NOTE — Progress Notes (Unsigned)
Cardiology Office Note:   Date:  12/20/2022  NAME:  Natalie Natalie    MRN: [id number] DOB:  1956/06/07   PCP:  Kem Boroughs, MD  Cardiologist:  None  Electrophysiologist:  None   Referring MD: Kem Boroughs, MD   Chief Complaint  Patient presents with   Follow-up        History of Present Illness:   Natalie Natalie is a 67 y.o. female with a hx of PVCs who presents for follow-up.  Her husband recently passed away from inferior STEMI and VF arrest late last year.  She reports she is still dealing with this.  She had to sell his [employer] business.  She is finally closing the estate.  She reports it has been tough.  Her sleep has been disrupted.  She is actually done well from a cardiovascular standpoint.  Denies any chest pains or trouble breathing.  Lipids are well-controlled.  Coronary CTA was negative in 2022.  She is on metoprolol for PVCs.  Infrequent symptoms.  Problem List 1. PVC -3.7% burden -0 coronary calcium -normal CCTA 11/03/2020 -T chol 264, HDL 91, LDL 152, TG 100 -EF 55-60%  Past Medical History: Past Medical History:  Diagnosis Date   Lyme disease, unspecified    PVC (premature ventricular contraction)     Past Surgical History: Past Surgical History:  Procedure Laterality Date   ABDOMINAL HYSTERECTOMY     CESAREAN SECTION     CHOLECYSTECTOMY     HEMORRHOID SURGERY     TONSILLECTOMY      Current Medications: Current Meds  Medication Sig   albuterol (VENTOLIN HFA) 108 (90 Base) MCG/ACT inhaler Inhale into the lungs every 6 (six) hours as needed for wheezing or shortness of breath.   Biotin 1 MG CAPS Take by mouth.   buPROPion (WELLBUTRIN XL) 300 MG 24 hr tablet Take 300 mg by mouth daily.   butalbital-aspirin-caffeine (FIORINAL) 50-325-40 MG capsule Take 1 capsule by mouth 2 (two) times daily as needed for headache.   Calcium Carb-Cholecalciferol (OYSTER SHELL CALCIUM W/D) 500-5 MG-MCG TABS Take by mouth.   EPINEPHrine 0.3 mg/0.3 mL IJ SOAJ injection Inject 0.3  mg into the muscle as needed for anaphylaxis.   estradiol (ESTRACE) 2 MG tablet Take 2 mg by mouth daily.   fesoterodine (TOVIAZ) 4 MG TB24 tablet Take by mouth.   metoprolol succinate (TOPROL XL) 25 MG 24 hr tablet Take 1 tablet (25 mg total) by mouth daily.   minoxidil (LONITEN) 2.5 MG tablet Take 1.25 mg by mouth daily.   naproxen (NAPROSYN) 500 MG tablet Take 1,000 mg by mouth 2 (two) times daily with a meal.   pantoprazole (PROTONIX) 40 MG tablet Take 40 mg by mouth daily.   valACYclovir (VALTREX) 500 MG tablet Take 500 mg by mouth as needed.   zaleplon (SONATA) 10 MG capsule Take 10 mg by mouth at bedtime as needed.     Allergies:    Peanut-containing drug products, Egg-derived products, Lactose, Soy allergy, and Wheat   Social History: Social History   Socioeconomic History   Marital status: Married    Spouse name: Not on file   Number of children: 2   Years of education: Not on file   Highest education level: Not on file  Occupational History   Occupation: retired Engineer, site  Tobacco Use   Smoking status: Never   Smokeless tobacco: Never  Substance and Sexual Activity   Alcohol use: Never   Drug use: Never   Sexual activity:  Not on file  Other Topics Concern   Not on file  Social History Narrative   Not on file   Social Determinants of Health   Financial Resource Strain: Not on file  Food Insecurity: Not on file  Transportation Needs: Not on file  Physical Activity: Not on file  Stress: Not on file  Social Connections: Not on file     Family History: The patient's family history includes Heart attack in her father.  ROS:   All other ROS reviewed and negative. Pertinent positives noted in the HPI.     EKGs/Labs/Other Studies Reviewed:   The following studies were personally reviewed by me today:  Recent Labs: No results found for requested labs within last 365 days.   Recent Lipid Panel No results found for: "CHOL", "TRIG", "HDL", "CHOLHDL",  "VLDL", "LDLCALC", "LDLDIRECT"  Physical Exam:   VS:  BP 118/60 (BP Location: Left Arm, Patient Position: Sitting, Cuff Size: Normal)   Pulse 78   Ht 5\' 2"  (1.575 m)   Wt 160 lb 12.8 oz (72.9 kg)   SpO2 98%   BMI 29.41 kg/m    Wt Readings from Last 3 Encounters:  12/20/22 160 lb 12.8 oz (72.9 kg)  12/12/21 161 lb 3.2 oz (73.1 kg)  09/14/21 153 lb 9.6 oz (69.7 kg)    General: Well nourished, well developed, in no acute distress Head: Atraumatic, normal size  Eyes: PEERLA, EOMI  Neck: Supple, no JVD Endocrine: No thryomegaly Cardiac: Normal S1, S2; RRR; no murmurs, rubs, or gallops Lungs: Clear to auscultation bilaterally, no wheezing, rhonchi or rales  Abd: Soft, nontender, no hepatomegaly  Ext: No edema, pulses 2+ Musculoskeletal: No deformities, BUE and BLE strength normal and equal Skin: Warm and dry, no rashes   Neuro: Alert and oriented to person, place, time, and situation, CNII-XII grossly intact, no focal deficits  Psych: Normal mood and affect   ASSESSMENT:   Natalie Natalie is a 67 y.o. female who presents for the following: 1. PVC (premature ventricular contraction)     PLAN:   1. PVC (premature ventricular contraction) -Symptomatic PVCs.  Symptoms are improved on metoprolol.  Coronary CTA negative.  Calcium score 0.  Echo normal.  She is well-controlled on current dose.  Will continue this.  She will see me yearly.     Disposition: Return in about 1 year (around 12/20/2023).  Medication Adjustments/Labs and Tests Ordered: Current medicines are reviewed at length with the patient today.  Concerns regarding medicines are outlined above.  No orders of the defined types were placed in this encounter.  No orders of the defined types were placed in this encounter.   Patient Instructions  Medication Instructions:  The current medical regimen is effective;  continue present plan and medications.  *If you need a refill on your cardiac medications before your next  appointment, please call your pharmacy*   Follow-Up: At [facility], you and your health needs are our priority.  As part of our continuing mission to provide you with exceptional heart care, we have created designated Provider Care Teams.  These Care Teams include your primary Cardiologist (physician) and Advanced Practice Providers (APPs -  Physician Assistants and Nurse Practitioners) who all work together to provide you with the care you need, when you need it.  We recommend signing up for the patient portal called "MyChart".  Sign up information is provided on this After Visit Summary.  MyChart is used to connect with patients for Virtual Visits (Telemedicine).  Patients are able to view lab/test results, encounter notes, upcoming appointments, etc.  Non-urgent messages can be sent to your provider as well.   To learn more about what you can do with MyChart, go to [email or web address].    Your next appointment:   12 month(s)  Provider:   Lennie Odor, MD       Time Spent with Patient: I have spent a total of 25 minutes with patient reviewing hospital notes, telemetry, EKGs, labs and examining the patient as well as establishing an assessment and plan that was discussed with the patient.  > 50% of time was spent in direct patient care.  Signed, Lenna Gilford. Flora Lipps, MD, [facility]  [facility]  [address], [address] [place], [place] [address] [phone]  12/20/2022 3:17 PM

## 2022-12-20 ENCOUNTER — Encounter: Payer: Self-pay | Admitting: Cardiovascular Disease

## 2022-12-20 ENCOUNTER — Ambulatory Visit: Payer: Medicare Other | Attending: Cardiovascular Disease | Admitting: Cardiovascular Disease

## 2022-12-20 VITALS — BP 118/60 | HR 78 | Ht 62.0 in | Wt 160.8 lb

## 2022-12-20 DIAGNOSIS — I493 Ventricular premature depolarization: Secondary | ICD-10-CM | POA: Insufficient documentation

## 2022-12-20 NOTE — Patient Instructions (Signed)
Medication Instructions:  The current medical regimen is effective;  continue present plan and medications.  *If you need a refill on your cardiac medications before your next appointment, please call your pharmacy*   Follow-Up: At [facility], you and your health needs are our priority.  As part of our continuing mission to provide you with exceptional heart care, we have created designated Provider Care Teams.  These Care Teams include your primary Cardiologist (physician) and Advanced Practice Providers (APPs -  Physician Assistants and Nurse Practitioners) who all work together to provide you with the care you need, when you need it.  We recommend signing up for the patient portal called "MyChart".  Sign up information is provided on this After Visit Summary.  MyChart is used to connect with patients for Virtual Visits (Telemedicine).  Patients are able to view lab/test results, encounter notes, upcoming appointments, etc.  Non-urgent messages can be sent to your provider as well.   To learn more about what you can do with MyChart, go to [email or web address].    Your next appointment:   12 month(s)  Provider:   [place] O'Neal, MD   

## 2023-01-30 ENCOUNTER — Other Ambulatory Visit: Payer: Self-pay | Admitting: Cardiovascular Disease

## 2024-02-15 ENCOUNTER — Other Ambulatory Visit: Payer: Self-pay | Admitting: Cardiovascular Disease

## 2024-02-22 ENCOUNTER — Other Ambulatory Visit: Payer: Self-pay | Admitting: Cardiovascular Disease

## 2024-05-11 NOTE — Progress Notes (Unsigned)
 Cardiology Office Note:  .   Date:  05/13/2024  ID:  Natalie Wyatt, DOB 08/08/1955, MRN [id number] PCP: Eveline Larve, MD  [facility] Providers Cardiologist:  None { History of Present Illness: .    Chief Complaint  Patient presents with   Follow-up    Natalie Wyatt is a 68 y.o. female with history of PVCs who presents for follow-up.    History of Present Illness   Natalie Wyatt is a 68 year old female with a history of PVCs who presents for follow-up.  She denies experiencing any extra heartbeats recently and reports that she is taking metoprolol  succinate 25 mg daily.  She describes a recent episode where turning her neck caused a burning sensation. No chest pain, trouble breathing, or leg swelling. A recent blood pressure reading was elevated at 145/100 mmHg, which she attributes to anxiety related to finding the clinic and checking in. She denies having high blood pressure historically.  She has lost a significant amount of weight and is currently at 132 pounds, attributing this to the use of a medication called Zepbound. She is nearing her weight loss goal and feels well overall.  No history of diabetes or other chronic medical issues. Her family history includes heart-related issues on her husband's side.          Problem List 1. PVC -3.7% burden -0 coronary calcium -normal CCTA 11/03/2020 -T chol 264, HDL 91, LDL 152, TG 100 -EF 55-60%    ROS: All other ROS reviewed and negative. Pertinent positives noted in the HPI.     Studies Reviewed: SABRA   EKG Interpretation Date/Time:  Wednesday May 13 2024 16:06:36 EDT Ventricular Rate:  74 PR Interval:  150 QRS Duration:  78 QT Interval:  370 QTC Calculation: 410 R Axis:   -2  Text Interpretation: Normal sinus rhythm Normal ECG   Confirmed by Barbaraann Kotyk [phone]) on 05/13/2024 4:15:26 PM   TTE 11/24/2020  1. Left ventricular ejection fraction, by estimation, is 55 to 60%. The  left ventricle has normal  function. The left ventricle has no regional  wall motion abnormalities. Left ventricular diastolic parameters were  normal.   2. Right ventricular systolic function is normal. The right ventricular  size is normal. There is normal pulmonary artery systolic pressure.   3. The mitral valve is normal in structure. Trivial mitral valve  regurgitation. No evidence of mitral stenosis.   4. The aortic valve is tricuspid. Aortic valve regurgitation is mild. No  aortic stenosis is present.   5. The inferior vena cava is normal in size with greater than 50%  respiratory variability, suggesting right atrial pressure of 3 mmHg.  Physical Exam:   VS:  BP (!) 145/100   Pulse 74   Ht 5' 2 (1.575 m)   Wt 132 lb (59.9 kg)   SpO2 98%   BMI 24.14 kg/m    Wt Readings from Last 3 Encounters:  05/13/24 132 lb (59.9 kg)  12/20/22 160 lb 12.8 oz (72.9 kg)  12/12/21 161 lb 3.2 oz (73.1 kg)    GEN: Well nourished, well developed in no acute distress NECK: No JVD; No carotid bruits CARDIAC: RRR, no murmurs, rubs, gallops RESPIRATORY:  Clear to auscultation without rales, wheezing or rhonchi  ABDOMEN: Soft, non-tender, non-distended EXTREMITIES:  No edema; No deformity  ASSESSMENT AND PLAN: .   Assessment and Plan    Ventricular premature complexes (PVCs) PVCs controlled with medication, asymptomatic. - Continue metoprolol  succinate 25 mg daily. -  Follow up in one year.  Elevated blood pressure Blood pressure at 145/100 mmHg, asymptomatic. - Monitor blood pressure regularly.              Follow-up: Return in about 1 year (around 05/13/2025).   Signed, Darryle DASEN. Barbaraann, MD, [facility]  [facility]  [address] [place], [place] [address] [phone]  4:30 PM

## 2024-05-13 ENCOUNTER — Encounter: Payer: Self-pay | Admitting: Cardiovascular Disease

## 2024-05-13 ENCOUNTER — Ambulatory Visit: Attending: Cardiovascular Disease | Admitting: Cardiovascular Disease

## 2024-05-13 VITALS — BP 145/100 | HR 74 | Ht 62.0 in | Wt 132.0 lb

## 2024-05-13 DIAGNOSIS — I493 Ventricular premature depolarization: Secondary | ICD-10-CM | POA: Diagnosis not present

## 2024-05-13 NOTE — Patient Instructions (Signed)
 Medication Instructions:  Your physician recommends that you continue on your current medications as directed. Please refer to the Current Medication list given to you today.  *If you need a refill on your cardiac medications before your next appointment, please call your pharmacy*  Lab Work: NONE  If you have labs (blood work) drawn today and your tests are completely normal, you will receive your results only by: MyChart Message (if you have MyChart) OR A paper copy in the mail If you have any lab test that is abnormal or we need to change your treatment, we will call you to review the results.  Testing/Procedures: NONE  Follow-Up: At [facility], you and your health needs are our priority.  As part of our continuing mission to provide you with exceptional heart care, our providers are all part of one team.  This team includes your primary Cardiologist (physician) and Advanced Practice Providers or APPs (Physician Assistants and Nurse Practitioners) who all work together to provide you with the care you need, when you need it.  Your next appointment:   1 year(s)  Provider:   Orren Fabry, PA-C, Rollo Louder, PA-C, Olivia Pavy, PA-C, Damien Braver, NP, or Xika Zhao, NP
